# Patient Record
Sex: Female | Born: 1965 | Race: White | Hispanic: No | Marital: Married | Smoking: Never smoker
Health system: Southern US, Community
[De-identification: ages and names within clinical notes are randomized; demographics above are authoritative.]

## PROBLEM LIST (undated history)

## (undated) DIAGNOSIS — J189 Pneumonia, unspecified organism: Secondary | ICD-10-CM

## (undated) DIAGNOSIS — D649 Anemia, unspecified: Secondary | ICD-10-CM

## (undated) DIAGNOSIS — E538 Deficiency of other specified B group vitamins: Secondary | ICD-10-CM

## (undated) DIAGNOSIS — M1990 Unspecified osteoarthritis, unspecified site: Secondary | ICD-10-CM

## (undated) DIAGNOSIS — E039 Hypothyroidism, unspecified: Secondary | ICD-10-CM

## (undated) DIAGNOSIS — Z8489 Family history of other specified conditions: Secondary | ICD-10-CM

## (undated) DIAGNOSIS — K620 Anal polyp: Secondary | ICD-10-CM

## (undated) DIAGNOSIS — G709 Myoneural disorder, unspecified: Secondary | ICD-10-CM

## (undated) DIAGNOSIS — E785 Hyperlipidemia, unspecified: Secondary | ICD-10-CM

## (undated) DIAGNOSIS — K5792 Diverticulitis of intestine, part unspecified, without perforation or abscess without bleeding: Secondary | ICD-10-CM

## (undated) DIAGNOSIS — K5730 Diverticulosis of large intestine without perforation or abscess without bleeding: Secondary | ICD-10-CM

## (undated) DIAGNOSIS — K635 Polyp of colon: Secondary | ICD-10-CM

## (undated) DIAGNOSIS — K2980 Duodenitis without bleeding: Secondary | ICD-10-CM

## (undated) DIAGNOSIS — I10 Essential (primary) hypertension: Secondary | ICD-10-CM

## (undated) DIAGNOSIS — R1032 Left lower quadrant pain: Secondary | ICD-10-CM

## (undated) DIAGNOSIS — K219 Gastro-esophageal reflux disease without esophagitis: Secondary | ICD-10-CM

## (undated) HISTORY — DX: Hyperlipidemia, unspecified: E78.5

## (undated) HISTORY — DX: Anal polyp: K62.0

## (undated) HISTORY — DX: Deficiency of other specified B group vitamins: E53.8

## (undated) HISTORY — DX: Hypothyroidism, unspecified: E03.9

## (undated) HISTORY — DX: Polyp of colon: K63.5

## (undated) HISTORY — DX: Diverticulitis of intestine, part unspecified, without perforation or abscess without bleeding: K57.92

## (undated) HISTORY — DX: Left lower quadrant pain: R10.32

## (undated) HISTORY — DX: Duodenitis without bleeding: K29.80

## (undated) HISTORY — DX: Diverticulosis of large intestine without perforation or abscess without bleeding: K57.30

---

## 1998-02-28 ENCOUNTER — Ambulatory Visit (HOSPITAL_COMMUNITY): Admission: RE | Admit: 1998-02-28 | Discharge: 1998-02-28 | Payer: Self-pay | Admitting: Obstetrics and Gynecology

## 1998-02-28 ENCOUNTER — Encounter: Payer: Self-pay | Admitting: Obstetrics and Gynecology

## 1999-06-25 ENCOUNTER — Other Ambulatory Visit: Admission: RE | Admit: 1999-06-25 | Discharge: 1999-06-25 | Payer: Self-pay | Admitting: Obstetrics and Gynecology

## 2000-08-19 ENCOUNTER — Other Ambulatory Visit: Admission: RE | Admit: 2000-08-19 | Discharge: 2000-08-19 | Payer: Self-pay | Admitting: Obstetrics and Gynecology

## 2001-10-27 ENCOUNTER — Other Ambulatory Visit: Admission: RE | Admit: 2001-10-27 | Discharge: 2001-10-27 | Payer: Self-pay | Admitting: Obstetrics and Gynecology

## 2001-11-02 ENCOUNTER — Ambulatory Visit (HOSPITAL_COMMUNITY): Admission: RE | Admit: 2001-11-02 | Discharge: 2001-11-02 | Payer: Self-pay | Admitting: Obstetrics and Gynecology

## 2001-11-02 ENCOUNTER — Encounter: Payer: Self-pay | Admitting: Obstetrics and Gynecology

## 2003-01-15 ENCOUNTER — Other Ambulatory Visit: Admission: RE | Admit: 2003-01-15 | Discharge: 2003-01-15 | Payer: Self-pay | Admitting: Obstetrics and Gynecology

## 2004-07-19 ENCOUNTER — Encounter (INDEPENDENT_AMBULATORY_CARE_PROVIDER_SITE_OTHER): Payer: Self-pay | Admitting: *Deleted

## 2004-07-19 ENCOUNTER — Ambulatory Visit (HOSPITAL_COMMUNITY): Admission: AD | Admit: 2004-07-19 | Discharge: 2004-07-19 | Payer: Self-pay | Admitting: Obstetrics and Gynecology

## 2005-05-04 HISTORY — PX: ESOPHAGOGASTRODUODENOSCOPY: SHX1529

## 2005-12-01 ENCOUNTER — Ambulatory Visit: Payer: Self-pay | Admitting: Gastroenterology

## 2005-12-07 ENCOUNTER — Ambulatory Visit: Payer: Self-pay | Admitting: Gastroenterology

## 2005-12-14 ENCOUNTER — Ambulatory Visit: Payer: Self-pay | Admitting: Gastroenterology

## 2005-12-22 ENCOUNTER — Ambulatory Visit: Payer: Self-pay | Admitting: Gastroenterology

## 2006-01-21 ENCOUNTER — Ambulatory Visit (HOSPITAL_COMMUNITY): Admission: RE | Admit: 2006-01-21 | Discharge: 2006-01-21 | Payer: Self-pay | Admitting: Gastroenterology

## 2006-05-04 DIAGNOSIS — K5792 Diverticulitis of intestine, part unspecified, without perforation or abscess without bleeding: Secondary | ICD-10-CM

## 2006-05-04 HISTORY — PX: COLON RESECTION: SHX5231

## 2006-05-04 HISTORY — DX: Diverticulitis of intestine, part unspecified, without perforation or abscess without bleeding: K57.92

## 2006-05-04 HISTORY — PX: ABDOMINAL HYSTERECTOMY: SUR658

## 2006-09-10 ENCOUNTER — Ambulatory Visit: Payer: Self-pay | Admitting: Cardiology

## 2007-02-23 ENCOUNTER — Inpatient Hospital Stay (HOSPITAL_COMMUNITY): Admission: RE | Admit: 2007-02-23 | Discharge: 2007-02-27 | Payer: Self-pay | Admitting: General Surgery

## 2007-02-23 ENCOUNTER — Encounter (INDEPENDENT_AMBULATORY_CARE_PROVIDER_SITE_OTHER): Payer: Self-pay | Admitting: General Surgery

## 2009-03-15 ENCOUNTER — Encounter (INDEPENDENT_AMBULATORY_CARE_PROVIDER_SITE_OTHER): Payer: Self-pay | Admitting: *Deleted

## 2009-03-15 ENCOUNTER — Telehealth: Payer: Self-pay | Admitting: Gastroenterology

## 2009-03-19 ENCOUNTER — Ambulatory Visit: Payer: Self-pay | Admitting: Gastroenterology

## 2009-03-19 DIAGNOSIS — R1032 Left lower quadrant pain: Secondary | ICD-10-CM | POA: Insufficient documentation

## 2009-03-19 DIAGNOSIS — K5730 Diverticulosis of large intestine without perforation or abscess without bleeding: Secondary | ICD-10-CM | POA: Insufficient documentation

## 2009-03-20 ENCOUNTER — Ambulatory Visit: Payer: Self-pay | Admitting: Cardiology

## 2009-03-21 ENCOUNTER — Telehealth (INDEPENDENT_AMBULATORY_CARE_PROVIDER_SITE_OTHER): Payer: Self-pay | Admitting: *Deleted

## 2009-03-22 LAB — CONVERTED CEMR LAB
Hemoglobin: 12.7 g/dL (ref 12.0–15.0)
Leukocytes, UA: NEGATIVE
Lymphs Abs: 2.1 10*3/uL (ref 0.7–4.0)
MCHC: 34.9 g/dL (ref 30.0–36.0)
MCV: 91.1 fL (ref 78.0–100.0)
Nitrite: NEGATIVE
Platelets: 208 10*3/uL (ref 150.0–400.0)
RBC: 4 M/uL (ref 3.87–5.11)
Specific Gravity, Urine: >=1.03 (ref 1.000–1.030)
Total Protein, Urine: NEGATIVE mg/dL
Urobilinogen, UA: 0.2 (ref 0.0–1.0)
WBC: 6 10*3/uL (ref 4.5–10.5)
pH: 5 (ref 5.0–8.0)

## 2009-04-15 DIAGNOSIS — E538 Deficiency of other specified B group vitamins: Secondary | ICD-10-CM | POA: Insufficient documentation

## 2009-04-15 DIAGNOSIS — E039 Hypothyroidism, unspecified: Secondary | ICD-10-CM | POA: Insufficient documentation

## 2009-07-18 ENCOUNTER — Encounter: Admission: RE | Admit: 2009-07-18 | Discharge: 2009-07-18 | Payer: Self-pay | Admitting: Obstetrics and Gynecology

## 2010-05-04 HISTORY — PX: COLONOSCOPY: SHX174

## 2010-05-25 ENCOUNTER — Encounter: Payer: Self-pay | Admitting: Nurse Practitioner

## 2010-09-16 NOTE — Op Note (Signed)
NAMEBRADLEY, Emily Carroll             ACCOUNT NO.:  [id number]   MEDICAL RECORD NO.:  [id number]          PATIENT TYPE:  INP   LOCATION:  1534                         FACILITY:  [facility]   PHYSICIAN:  Michelle L. Grewal, M.D.DATE OF BIRTH:  [birth year]   DATE OF PROCEDURE:  02/23/2007  DATE OF DISCHARGE:                               OPERATIVE REPORT   PREOPERATIVE DIAGNOSIS:  Diverticulitis and symptomatic fibroids and  menorrhagia.   POSTOPERATIVE DIAGNOSIS:  Diverticulitis and symptomatic fibroids and  menorrhagia.   PROCEDURE:  Exploratory laparotomy and total abdominal hysterectomy.   SURGEON:  Dr. Vincente Poli.   COSURGEON:  Dr. Glenna Fellows.   ANESTHESIA:  General.   ESTIMATED BLOOD LOSS:  200 mL with this procedure.   PROCEDURE:  The patient was taken to the operating room initially by Dr.  Johna Sheriff and Dr. Jamey Ripa.  She had been consented prior to surgery.  Dr.  Johna Sheriff started the surgery after she was intubated and that portion of  the surgery will be dictated by him.  Briefly he performed a  Pfannenstiel incision and performed partial sigmoid colon resection.  After he finished that procedure, I then scrubbed in.  At the point that  I scrubbed and there was a small Pfannenstiel incision and an  infraumbilical incision from his laparoscopic port.  We then did a  thorough exam of the pelvis.  There was no active bleeding at this  point.  The uterus was very myomatous and she had a very large baseball  size fibroid that was anterior and more toward the left growing into the  left side of the broad ligament.  Her ovary appeared normal.  Her right  ovary was slightly adherent posteriorly, but I was able to free that up  with blunt dissection.  I previously had consented her for possible left  oophorectomy; however the patient did not need that.  We decided to  proceed initially with just hysterectomy.  We then placed Kelly clamps  beside each triple pedicle, elevated the  uterus all the way through the  incision, identified each round ligament, transected that, created our  bladder flap sharply and then digitally.  We then placed curved Heaney  clamps just across the triple pedicle on either side.  Each pedicle was  cut and suture ligated using 0 Vicryl suture.  We then walked our way  down the broad ligament on each side and then clamped the uterine artery  on each side using curved Heaney clamps.  The pedicle was suture ligated  using 0 Vicryl suture.  I then developed the bladder flap more  thoroughly.  We then dissected and clamped using straight Heaney clamps  using straight Heaney clamps just beside the cervix staying just beside  the cervix and we walked our way down the cardinal and the uterosacral  ligament.  Once we did this, we then reached the level of the external  os and then we removed the specimen completely.  Each pedicle had been  suture ligated using O Vicryl suture and after the specimen was removed  and we confirmed the external cervical os had  been removed as well.  We  then closed the vaginal cuff using figure-of-eights using 0 Vicryl  suture.  Irrigation was performed.  Hemostasis was noted.  Dr. Johna Sheriff  looked also at his anastomosis site and it appeared to be normal.  All  laparotomy pads removed from the abdomen and at this point then Dr.  Johna Sheriff performed a closure of the  peritoneum and the fascia and the skin and that will be dictated by him.  I scrubbed out and all sponge, lap and instrument counts were correct  x2.  The patient went to recovery room in stable condition.   PATHOLOGY:  Uterus and cervix.      Michelle L. Vincente Poli, M.D.  Electronically Signed     MLG/MEDQ  D:  02/23/2007  T:  02/24/2007  Job:  [id number]   cc:   Lorne Skeens. Hoxworth, M.D.

## 2010-09-16 NOTE — Op Note (Signed)
Emily Carroll, Emily Carroll             ACCOUNT NO.:  [id number]   MEDICAL RECORD NO.:  [id number]          PATIENT TYPE:  INP   LOCATION:  0003                         FACILITY:  [facility]   PHYSICIAN:  Sharlet Salina T. Hoxworth, M.D.DATE OF BIRTH:  Feb 14, 1966   DATE OF PROCEDURE:  02/23/2007  DATE OF DISCHARGE:                               OPERATIVE REPORT   PRE AND POSTOPERATIVE DIAGNOSIS:  Diverticulitis of the left and sigmoid  colon.   SURGICAL PROCEDURES:  Hand assisted laparoscopic partial left colectomy.   SURGEON:  Lorne Skeens. Hoxworth, M.D.   ASSISTANT:  Dr. Cicero Duck   ANESTHESIA:  General.   BRIEF HISTORY:  Emily Carroll is a 45 year old female with repeated  episodes of diverticulitis involving the left and sigmoid colon  documented on CT scan and previous colonoscopy.  Due to repeated and  ongoing problems, we have elected to proceed with elective partial  colectomy.  The nature of the procedure, indications, risks of bleeding,  infection, and anastomotic complications have been discussed and  understood.  In addition, the patient has large symptomatic fibroids and  hysterectomy is planned by Dr. Vincente Poli at the end of her colectomy.   DESCRIPTION OF OPERATION:  The patient brought to the operating room and  placed in supine position on the operating table and general  endotracheal anesthesia was induced.  She had received a mechanical  antibiotic bowel prep at home.  Broad-spectrum IV antibiotics were given  intravenously.  PAS were placed.  She was carefully positioned in the  semilithotomy position on a bean bag and the abdomen and perineum  sterilely prepped and draped and Foley catheter placed.  Abdominal  access was obtained with a 1 cm supraumbilical incision open Hasson  technique through mattress suture of 0 Vicryl and pneumoperitoneum  established.  Under direct vision 5 mm trocars were placed in the right  and left lateral lower abdomen.  There was some  evidence of chronic  inflammatory change along the mid descending colon down toward the  sigmoid with adhesions up to the lateral abdominal wall.  At this point  a 7 cm Pfannenstiel incision was made in the suprapubic skin crease and  dissection carried down through subcutaneous tissue.  Anterior fascia  was divided transversely.  This was then elevated up off the rectus  muscles which were divided along the midline with cautery and the  abdomen entered.  A Gelport retractor system was placed.  On palpating  through the Gelport, there were some areas of inflammation and  thickening of the distal left and sigmoid colon.  Initially the left  sigmoid colon was elevated and beginning medially at the sacral  promontory the peritoneum was incised up along the rectosigmoid and left  colon and the mesentery of the left and sigmoid colon, dissected up out  of the retroperitoneum with careful blunt dissection.  This dissection  progressed adequately but possibly due to his inflammatory change of  diverticulitis.  It was really difficult to get into a really clean  avascular plane.  However, the iliac vessels were carefully identified  and protected and I was  unable, however, to clearly identify the ureter  through this approach.  No division of any mesentery was undertaken at  this point because of this.  At this point I then mobilized the colon  laterally.  There were some inflammatory attachments that were divided  with cautery.  Peritoneum along the line of Toldt of the rectosigmoid  extending up to the left colon was divided with cautery under direct  vision and the colon and mesentery bluntly dissected up out of the  retroperitoneum.  For this approach I could clearly see the left ureter  which was posterior and protected.  Dissection was carried up along the  splenic flexure which was completely mobilized dividing the splenocolic  ligament under direct vision.  The omentum was dissected up off  of the  distal transverse colon to about the mid transverse colon and the lesser  omentum also divided in the lesser sac working back to about the  midline, thoroughly mobilizing the distal transverse colon.  At this  point I felt likely had enough mobilization and the Gelport was removed  but bringing the specimen up through the rim of the small Pfannenstiel  incision, the diverticulitis was fairly proximal on the left colon.  I  was not quite able to bring it up through the wound.  I therefore  replaced the Gelport and this point I did find some further areolar  attachments along the mid-left colon posteriorly that tethered things  and these were carefully divided with cautery, again protecting the  ureter more fully mobilizing the mid and proximal left colon.  At this  point removing the Gelport, the specimen came up nicely through the  small incision and I could get back to healthy proximal bowel which was  probably proximal left colon and then the more distal rectosigmoid was  quite soft and normal.  The points of proximal and distal resection were  chosen.  The LigaSure was used to then divide the mesentery.  The ureter  had been identified and was well posterior.  The mesentery in the  involved segment was completely divided.  The rectosigmoid was divided  between [facility] and Kocher clamps and then further dissection was carried  up proximally to clearly normal colon.  At this point this was clamped  with a Kocher, stay sutures were placed in the proximal bowel, bowel  divided and the specimen removed.  Mesentery was inspected for  hemostasis which appeared complete.  Following this, sewn full-thickness  anastomosis using interrupted inverting 2-0 silk sutures was performed.  This appeared to be under no tension with good blood supply and widely  patent.  Following completion anastomosis, bowel was returned to the  abdominal cavity and laparoscopy was performed showing bowel was  under  no tension.  There was no bleeding.  The anastomosis appeared to be  lying normally without any twisting.  The abdomen was irrigated.  The  retractor and all gloves and instruments were all changed.  At this  point Dr. Vincente Poli came in and completed hysterectomy as dictated  separately.  Following completion hysterectomy, the Pfannenstiel  incision was closed with running 0 Vicryl on the midline peritoneum and  the anterior fascia was closed with running #1 PDS beginning at either  end and tied centrally.  The tissue was infiltrated with Marcaine.  The  mattress suture at the supraumbilical incision had been secured.  Skin  incisions were closed with running or interrupted subcuticular 4-0  Vicryl and Dermabond.  Sponges  counts correct.  The patient taken  recovery in good condition.      Lorne Skeens. Hoxworth, M.D.  Electronically Signed     BTH/MEDQ  D:  02/23/2007  T:  02/24/2007  Job:  [id number]

## 2010-09-19 NOTE — Assessment & Plan Note (Signed)
 [facility]                           GASTROENTEROLOGY OFFICE NOTE   NAME:[name], Emily Carroll                    MRN:          [id number]  DATE:12/22/2005                            DOB:          June 02, 1965    Dennie Bible has resolving diverticulitis, but continues with gas, bloating, B-12  deficiency requiring parenteral therapy.  I thought it was a good  possibility that she had bacterial overgrowth syndrome and treated her with  Xifaxin 400 mg t.i.d. along with Pro-Biotic therapy for ten days.  She  continued to complain of gas, bloating, belching and burping, despite being  on Prevacid at 30 mg a day.  She takes Synthroid for her chronic thyroid  dysfunction.   I have referred her to Dr. Jaclynn Guarneri for sigmoid resection since she has  had three episodes of diverticulitis in the last year and a half.  I have  given her a low gas diet and I have asked her to continue fiber as  tolerated.  Additional lab data showed normal celiac sprue panel and normal  screening laboratory tests except for a B-12 deficiency.  She is receiving  parenteral therapy.  I have sent by the lab to check intrinsic factor and  any parietal cell antibodies.                                   Vania Rea. Jarold Motto, MD, [place]een [place], [place]   DRP/MedQ  DD:  12/22/2005  DT:  12/22/2005  Job #:  [id number]

## 2010-09-19 NOTE — Op Note (Signed)
NAMESAKINAH, Emily Carroll             ACCOUNT NO.:  [id number]   MEDICAL RECORD NO.:  [id number]          PATIENT TYPE:  AMB   LOCATION:  SDC                           FACILITY:  [facility]   PHYSICIAN:  Michelle L. Grewal, M.D.DATE OF BIRTH:  Sep 05, 1965   DATE OF PROCEDURE:  07/19/2004  DATE OF DISCHARGE:                                 OPERATIVE REPORT   PREOPERATIVE DIAGNOSIS:  Missed abortion.   POSTOPERATIVE DIAGNOSIS:  Missed abortion.   PROCEDURE:  Dilatation and evacuation.   SURGEON:  Michelle L. Vincente Poli, M.D.   ANESTHESIA:  MAC with local.   SPECIMENS:  Products of conception.   ESTIMATED BLOOD LOSS:  Minimal.   COMPLICATIONS:  None.   DESCRIPTION OF PROCEDURE:  The patient was taken to the operating room.  She  was given sedation and placed in the lithotomy position.  She was prepped  and draped in the usual sterile fashion.  An in-and-out catheter was used to  empty the bladder.   A speculum was inserted into the vagina.  The cervix was grasped with a  tenaculum, and a paracervical block was performed in the standard fashion.  The cervical internal os was gently dilated using Pratt dilators.  The  cervix was a little open already.  We then inserted the #7 suction cannula,  and suction curettage was performed x2, with retrieval of minimal tissue.  A  sharp curette was inserted, and the uterine cavity was thoroughly curetted  of all tissue which appeared slightly necrotic and consistent with products  of conception.  A final suction curettage was then performed.  At the end of  the procedure, all sponge, lap, and instrument count were correct x2.  All  instruments were removed from the vagina.   The patient tolerated the procedure extremely well and went to the recovery  room in stable condition.      MLG/MEDQ  D:  07/19/2004  T:  07/20/2004  Job:  [id number]

## 2010-09-19 NOTE — Assessment & Plan Note (Signed)
[facility]                           GASTROENTEROLOGY OFFICE NOTE   NAME:Mode, BRANDACE CARGLE                    MRN:          [id number]  DATE:12/01/2005                            DOB:          1966-04-10    Mrs. Galasso is a 45 year old white female, Engineer, drilling at [employer],  referred through the courtesy of [facility] and Dr. Lelon Huh for evaluation  of upper and lower gastrointestinal complaints consistent with mostly of  lower abdominal discomfort with diverticulitis concern by CT scan.  Also  chronic abdominal gas, bloating, and early satiety.  This patient has had  two episodes over the last 1-1/2 years of left lower quadrant pain  associated with nausea and vomiting, low-grade fever, and change in bowel  habits.  She most recently saw Dr. Lelon Huh at [facility] and was felt to  have diverticulitis and was treated with metronidazole for two weeks after  CT scan. On November 13, 2005, showed descending colon and sigmoid colon  diverticulitis.  She is currently asymptomatic in terms of lower GI  complaints.  During the illness, she did have some nausea and vomiting and  what sounds like a small amount of hematemesis.  She has had years of  postprandial gas, bloating, belching, burping, and early satiety but no  associated weight loss.  She relates that she had diagnosed ulcer years  ago by upper GI series.  She denies abuse of NSAIDs, alcohol, or cigarettes.  She generally follows a regular diet and denies any specific food  intolerances.  She has never had endoscopic exams of her bowels.   She currently is having fairly regular bowel habits and denies significant  diarrhea or constipation or rectal bleeding.   PAST MEDICAL HISTORY:  Otherwise noncontributory except for history of  chronic thyroid dysfunction.   MEDICATIONS:  1.  She is on Synthroid 0.8 mcg q.a.m. and 0.1 mg q.p.m.  2.  She uses p.r.n. Aleve.   ALLERGIES:  She denies drug  allergies.   FAMILY HISTORY:  Remarkable for colon polyps in her paternal grandfather.  Otherwise noncontributory.   SOCIAL HISTORY:  She is married and lives with her husband.  She has some  college education.  She works in Presenter, broadcasting at [employer].  She does not smoke  or use ethanol.   REVIEW OF SYSTEMS:  Noncontributory.  Her last menstrual period was a week  ago.  She does suffer from uterine fibroids.  Apparently is unable to have  children.   PHYSICAL EXAMINATION:  GENERAL:  Exam shows her to be an attractive, healthy-  appearing white female who appears her stated age.  VITAL SIGNS:  She is 5 feet 6 inches tall, weighs 211 pounds.  Blood  pressure 116/84, pulse 76 and regular.  I could not appreciate stigmata of  chronic liver disease, thyromegaly or lymphadenopathy.  CHEST:  Entirely clear to percussion and auscultation.  CARDIAC:  There are no murmurs, gallops or rubs noted.  ABDOMEN:  I could not appreciate hepatosplenomegaly, abdominal masses or  tenderness.  Bowel sounds are normal.  EXTREMITIES:  Unremarkable.  Reflexes normal.  MENTAL STATUS:  Clear.  RECTAL:  Inspection of the rectum was unremarkable as was the rectal exam.  Stool was guaiac negative.   IMPRESSION:  1.  Recurrent diverticulitis in a relatively young, 45 year old white      female.  Certainly raising the need for elective sigmoid colon      resection.  2.  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  3.  Vague history of peptic ulcer disease.  4.  Vague history of hematemesis.  5.  Symptomatic uterine fibroids.  6.  History of chronic thyroid dysfunction.   RECOMMENDATION:  1.  Outpatient endoscopy and colonoscopy.  2.  High fiber diet with daily Citrucel and liberal p.o. fluids.  3.  Patient education on diverticulitis and its management.  4.  P.r.n. Levsin 0.125 mg every four to six hours.  5.   Screening laboratory parameters.  6.  Other medications as per primary care.                                   Vania Rea. Jarold Motto, MD, [place], [place]   DRP/MedQ  DD:  12/01/2005  DT:  12/01/2005  Job #:  [id number]   cc:   Deboraha Sprang, M.D.

## 2010-09-19 NOTE — Discharge Summary (Signed)
Emily Carroll, [name]             ACCOUNT NO.:  [id number]   MEDICAL RECORD NO.:  [id number]          PATIENT TYPE:  INP   LOCATION:  1534                         FACILITY:  [facility]   PHYSICIAN:  Sharlet Salina T. Hoxworth, M.D.DATE OF BIRTH:  1965/06/12   DATE OF ADMISSION:  02/23/2007  DATE OF DISCHARGE:  02/27/2007                               DISCHARGE SUMMARY   DISCHARGE DIAGNOSES:  1. Left colonic diverticulitis.  2. Myomatous uterus.   SURGICAL PROCEDURES:  1,  Laparoscopic partial left colectomy, Dr.  Johna Sheriff, on February 23, 2007.  1. Hysterectomy, Dr. Vincente Poli, October 22.   HISTORY OF PRESENT ILLNESS:  Emily Carroll is a 45 year old white female  with a history of multiple prior episodes of sigmoid colon  diverticulitis.  These have been managed by antibiotics but are  increasingly frequent and severe in nature.  The patient also has a  large symptomatic myomatous uterus and requires a hysterectomy.  In  consultation with Dr. Vincente Poli we have planned elective laparoscopic left  colectomy with a Pfannenstiel extraction site to be followed by  hysterectomy by Dr. Vincente Poli.  The patient is admitted for these  procedures.   PAST MEDICAL HISTORY:  Previous surgery significant only for D&C.  She  has hypothyroidism.   MEDICATIONS:  Synthroid 0.188 daily.   ALLERGIES:  None.   SOCIAL HISTORY, FAMILY HISTORY, REVIEW OF SYSTEMS:  See detailed H&P.   PERTINENT PHYSICAL EXAMINATION:  In general, well-developed female in no  acute distress, afebrile, vital signs within normal limits.  Abdomen is  soft, no tenderness and no palpable masses anteriorly.   HOSPITAL COURSE:  The patient was admitted on the morning of her  procedure following a mechanical antibiotic bowel prep at home.  She  underwent laparoscopic left partial colectomy with takedown of splenic  flexure with an area of significant acute and chronic diverticulitis in  the distal left colon.  Through a slightly enlarged  Pfannenstiel  extraction site, Dr. Vincente Poli then performed hysterectomy.  Her  postoperative course was smooth.  She had minimal pain and was started  on a clear liquid diet on the first postoperative day.  By the second  postoperative day she was having some flatus and diet was advanced as  tolerated.  She became progressively more ambulatory.  Diet was  advanced.  On October 26 she had had bowel movements.  Abdomen was soft  and nontender, wound healing  without infection.  Final pathology revealed diverticulosis and focal  diverticulitis in the left colon, and leiomyomata, benign, in the  uterus.  Discharge medications are the same as admission plus Vicodin  for pain.  Followup is to be in my office in 2 weeks.      Lorne Skeens. Hoxworth, M.D.  Electronically Signed     BTH/MEDQ  D:  03/21/2007  T:  03/22/2007  Job:  [id number]   cc:   Marcelino Duster L. Vincente Poli, M.D.  Fax: [phone]

## 2010-11-03 ENCOUNTER — Encounter: Payer: Self-pay | Admitting: Gastroenterology

## 2010-12-11 ENCOUNTER — Ambulatory Visit (INDEPENDENT_AMBULATORY_CARE_PROVIDER_SITE_OTHER): Payer: BC Managed Care – PPO | Admitting: Gastroenterology

## 2010-12-11 ENCOUNTER — Encounter: Payer: Self-pay | Admitting: Gastroenterology

## 2010-12-11 VITALS — BP 138/78 | HR 88 | Ht 66.5 in | Wt 229.0 lb

## 2010-12-11 DIAGNOSIS — Z9889 Other specified postprocedural states: Secondary | ICD-10-CM

## 2010-12-11 DIAGNOSIS — K644 Residual hemorrhoidal skin tags: Secondary | ICD-10-CM

## 2010-12-11 DIAGNOSIS — Z9049 Acquired absence of other specified parts of digestive tract: Secondary | ICD-10-CM

## 2010-12-11 DIAGNOSIS — K621 Rectal polyp: Secondary | ICD-10-CM

## 2010-12-11 DIAGNOSIS — K620 Anal polyp: Secondary | ICD-10-CM

## 2010-12-11 MED ORDER — PEG-KCL-NACL-NASULF-NA ASC-C 100 G PO SOLR: 1.0000 | Freq: Once | ORAL | Status: DC

## 2010-12-11 NOTE — Progress Notes (Signed)
This is a very pleasant 45 year old Caucasian female who has had previous sigmoid resection in 2008 Dr. Glenna Fellows. Last colonoscopy was performed in 2007. She's been doing well except for recent discomfort around her rectum with some painful wiping. She denies significant abdominal pain, bleeding, 4 history of rectal fissures or fistulae. She occasional has some left upper quadrant spasmodic type pain of unexplained etiology. She otherwise is healthy except for chronic thyroid dysfunction treated with thyroid replacement therapy.  Current Medications, Allergies, Past Medical History, Past Surgical History, Family History and Social History were reviewed in [employer] record.  Pertinent Review of Systems Negative   Physical Exam: Awake alert no acute distress appears stated age. Abdominal exam is benign without organomegaly, masses, or tenderness. I cannot appreciate splenomegaly in the left upper quadrant area. The specks the rectum does show a posterior skin tag-anal polyp is not eroded painful. I cannot appreciate any fissures or fistulae but digital exam was deferred.      Assessment and Plan: Posterior skin tag probably related to previous anal fissure. I will repeat followup colonoscopy to be complete, and refer her to Dr. Glenna Fellows for consideration of local excision of this irritating perianal skin tag. I provided a zinc oxide ointment to use on when necessary basis in the interim.  Please copy her primary care physician, referring physician, and pertinent subspecialists. Encounter Diagnosis  Name Primary?  . Rectal polyp Yes

## 2010-12-11 NOTE — Patient Instructions (Signed)
Your procedure has been scheduled for 12/29/2010, please follow the seperate instructions.  Your prescription(s) have been sent to you pharmacy.  We have referred you over to [facility] and we will contact you with an appt.  Buy Xinc Oxide OTC and use per rectum as needed.

## 2010-12-26 ENCOUNTER — Ambulatory Visit (INDEPENDENT_AMBULATORY_CARE_PROVIDER_SITE_OTHER): Payer: BC Managed Care – PPO | Admitting: General Surgery

## 2010-12-26 ENCOUNTER — Encounter (INDEPENDENT_AMBULATORY_CARE_PROVIDER_SITE_OTHER): Payer: Self-pay | Admitting: General Surgery

## 2010-12-26 VITALS — BP 142/98 | Temp 97.4°F

## 2010-12-26 DIAGNOSIS — K644 Residual hemorrhoidal skin tags: Secondary | ICD-10-CM

## 2010-12-26 NOTE — Progress Notes (Signed)
Chief complaint: Anal polyp  History: Patient returns to the office due to an anal polyp. She recently saw Dr. Jarold Motto. She is do routine colonoscopy. She has a history of sigmoid resection for diverticulitis by me in 2008. As far as her abdomen and GI tract she is doing well. She however notices some excess tissue at the anus and difficulty with hygiene because of this.  Physical exam: Gen.: An overweight but otherwise appears healthy Abdomen: Soft and nontender. Incisions well healed. Rectal: At the anal verge is a fairly large skin tag but this does not appear to be neoplastic.  Assessment plan: Anal skin tag. This is narrow based and I believe could be removed very nicely under local anesthesia in the office. She is however having her colonoscopy in 3 days and we elected to defer until this is completed. I will see her back in the near future and we will remove this in the office.

## 2010-12-26 NOTE — Patient Instructions (Signed)
Call for any questions or concerns.

## 2010-12-29 ENCOUNTER — Encounter: Payer: Self-pay | Admitting: Gastroenterology

## 2010-12-29 ENCOUNTER — Ambulatory Visit (AMBULATORY_SURGERY_CENTER): Payer: BC Managed Care – PPO | Admitting: Gastroenterology

## 2010-12-29 VITALS — BP 141/75 | HR 79 | Temp 99.4°F | Resp 14 | Ht 67.2 in | Wt 229.0 lb

## 2010-12-29 DIAGNOSIS — K5730 Diverticulosis of large intestine without perforation or abscess without bleeding: Secondary | ICD-10-CM

## 2010-12-29 DIAGNOSIS — D126 Benign neoplasm of colon, unspecified: Secondary | ICD-10-CM

## 2010-12-29 DIAGNOSIS — D139 Benign neoplasm of ill-defined sites within the digestive system: Secondary | ICD-10-CM

## 2010-12-29 DIAGNOSIS — Z1211 Encounter for screening for malignant neoplasm of colon: Secondary | ICD-10-CM

## 2010-12-29 DIAGNOSIS — K620 Anal polyp: Secondary | ICD-10-CM

## 2010-12-29 DIAGNOSIS — K621 Rectal polyp: Secondary | ICD-10-CM

## 2010-12-29 DIAGNOSIS — Z9049 Acquired absence of other specified parts of digestive tract: Secondary | ICD-10-CM

## 2010-12-29 MED ORDER — SODIUM CHLORIDE 0.9 % IV SOLN: 500.0000 mL | INTRAVENOUS | Status: DC

## 2010-12-29 NOTE — Progress Notes (Signed)
Pt voiced no complaints in the recovery room or on discharge. MAW

## 2010-12-29 NOTE — Patient Instructions (Signed)
See the picture page for your findings from your exam today.  Follow the green and blue discharge instruction sheets the rest of the day.  Resume your prior medications today. Per Dr. Jarold Motto surgical excision of anal papillae with Dr. Johna Sheriff - appointment already made.  Please call if any questions or concerns.

## 2010-12-30 ENCOUNTER — Telehealth: Payer: Self-pay | Admitting: *Deleted

## 2010-12-30 NOTE — Telephone Encounter (Signed)
No ID on voice mail:therefore, no message was left at this time.

## 2011-01-02 ENCOUNTER — Encounter: Payer: Self-pay | Admitting: Gastroenterology

## 2011-02-11 LAB — ABO/RH: ABO/RH(D): A POS

## 2011-02-11 LAB — CBC
HCT: 32.9 — ABNORMAL LOW
Hemoglobin: 13.6
MCHC: 34.4
MCV: 87.7
MCV: 88.4
RBC: 4.5
WBC: 7.6

## 2011-02-11 LAB — DIFFERENTIAL
Basophils Relative: 0
Eosinophils Absolute: 0.1
Eosinophils Relative: 1
Lymphocytes Relative: 28
Monocytes Absolute: 0.5
Monocytes Relative: 7

## 2011-02-11 LAB — COMPREHENSIVE METABOLIC PANEL
CO2: 27
Chloride: 105
Creatinine, Ser: 0.76
GFR calc Af Amer: >60
Potassium: 3.9
Sodium: 139
Total Protein: 6.8

## 2011-02-11 LAB — URINALYSIS, ROUTINE W REFLEX MICROSCOPIC
Glucose, UA: NEGATIVE
Ketones, ur: NEGATIVE
pH: 5.5

## 2011-02-11 LAB — PREGNANCY, URINE: Preg Test, Ur: NEGATIVE

## 2011-03-12 ENCOUNTER — Other Ambulatory Visit: Payer: Self-pay | Admitting: Internal Medicine

## 2011-03-12 DIAGNOSIS — E039 Hypothyroidism, unspecified: Secondary | ICD-10-CM

## 2011-03-16 ENCOUNTER — Other Ambulatory Visit: Payer: BC Managed Care – PPO

## 2011-03-30 ENCOUNTER — Other Ambulatory Visit: Payer: BC Managed Care – PPO

## 2011-03-30 ENCOUNTER — Ambulatory Visit
Admission: RE | Admit: 2011-03-30 | Discharge: 2011-03-30 | Disposition: A | Discharge status: Home / Self Care | Payer: BC Managed Care – PPO | Source: Ambulatory Visit | Attending: Internal Medicine | Admitting: Internal Medicine

## 2011-03-30 DIAGNOSIS — E039 Hypothyroidism, unspecified: Secondary | ICD-10-CM

## 2011-04-16 ENCOUNTER — Encounter (INDEPENDENT_AMBULATORY_CARE_PROVIDER_SITE_OTHER): Payer: Self-pay | Admitting: General Surgery

## 2011-11-26 ENCOUNTER — Other Ambulatory Visit (INDEPENDENT_AMBULATORY_CARE_PROVIDER_SITE_OTHER): Payer: Self-pay

## 2011-11-26 ENCOUNTER — Ambulatory Visit (INDEPENDENT_AMBULATORY_CARE_PROVIDER_SITE_OTHER): Payer: BC Managed Care – PPO | Admitting: General Surgery

## 2011-11-26 ENCOUNTER — Encounter (INDEPENDENT_AMBULATORY_CARE_PROVIDER_SITE_OTHER): Payer: Self-pay | Admitting: General Surgery

## 2011-11-26 VITALS — BP 124/78 | HR 96 | Temp 98.0°F | Resp 20 | Ht 66.0 in | Wt 199.0 lb

## 2011-11-26 DIAGNOSIS — K644 Residual hemorrhoidal skin tags: Secondary | ICD-10-CM

## 2011-11-26 DIAGNOSIS — K620 Anal polyp: Secondary | ICD-10-CM

## 2011-11-26 MED ORDER — LIDOCAINE HCL 2 % EX GEL: CUTANEOUS | Status: AC

## 2011-11-26 NOTE — Patient Instructions (Signed)
Expect some mild bleeding. Clean and shower or tub after bowel movements. Call as needed for problems or questions.

## 2011-11-26 NOTE — Progress Notes (Signed)
Chief complaint: Anal polyp  History: Patient returns to the office. She has had a colonoscopy which was unremarkable. The polyp at her anus continues to bother her with irritation and hygiene issues. She returns for removal.  Exam: On the anterior anal verge is a narrow based approximately 1.5 cm anal polyp or papilloma.  After explaining the procedure under local anesthesia this was completely excised and sent for permanent pathology. The base was cauterized. Wound care was explained. She is to return as needed.

## 2011-12-01 ENCOUNTER — Telehealth (INDEPENDENT_AMBULATORY_CARE_PROVIDER_SITE_OTHER): Payer: Self-pay

## 2011-12-01 NOTE — Progress Notes (Signed)
Called patient -- Left voice message to call our office for results.

## 2011-12-01 NOTE — Telephone Encounter (Signed)
Left message for patient to call our office re: Pathology results (Benign Polyps)

## 2012-01-28 ENCOUNTER — Other Ambulatory Visit: Payer: Self-pay | Admitting: Obstetrics and Gynecology

## 2013-10-04 ENCOUNTER — Encounter: Payer: Self-pay | Admitting: *Deleted

## 2013-10-04 ENCOUNTER — Ambulatory Visit: Payer: BC Managed Care – PPO | Admitting: Gastroenterology

## 2013-10-11 ENCOUNTER — Encounter: Payer: Self-pay | Admitting: *Deleted

## 2013-10-11 ENCOUNTER — Ambulatory Visit (INDEPENDENT_AMBULATORY_CARE_PROVIDER_SITE_OTHER): Payer: BC Managed Care – PPO | Admitting: Gastroenterology

## 2013-10-11 VITALS — BP 116/72 | HR 76 | Ht 66.0 in | Wt 210.0 lb

## 2013-10-11 DIAGNOSIS — R109 Unspecified abdominal pain: Secondary | ICD-10-CM | POA: Insufficient documentation

## 2013-10-11 DIAGNOSIS — R1011 Right upper quadrant pain: Secondary | ICD-10-CM

## 2013-10-11 DIAGNOSIS — R10A1 Flank pain, right side: Secondary | ICD-10-CM

## 2013-10-11 NOTE — Progress Notes (Addendum)
     10/11/2013 Emily Carroll [id number] 07/14/1965   History of Present Illness:  This is a pleasant 48 year old female who is previously known to Dr. Sharlett Iles. She underwent a colonoscopy most recently in August 2012 at which time she was found to have some nodularity at her sigmoid anastomosis. Biopsies showed benign colonic mucosa with hyperplastic changes. She also had some hypertrophied anal papilla. Recommended repeat colonoscopy was in 10 years from that time. She had previous left hemicolectomy for diverticulitis issues in 2008.  She presents to our office today with complaints of right-sided flank/right upper quadrant abdominal pain. She states that this has been present for the last couple of months. She describes it as a soreness, and states that it is nothing excruciating. Some days she doesn't pay much attention to it, but if she puts pressure on the area when sleeping at night, etc., then it bothers her. It also seems to be worse with movement, but she did not feel it is muscular in origin, and she denies any injury to that area. She admits to 3 episodes of nausea since this pain has been present. She says on each episode the nausea has occurred at nighttime and woke her from sleep. She denies any vomiting. She also admits to some intermittent diarrhea at times as well over the past couple of months, but for the most part she has constipation, which is not new for her. She's never experienced any similar pains or discomfort in the past in that location, but describes it as feeling similar to her previous diverticular pains that she had on the left side.  She denies fevers or any other possibly associated symptoms. Ultrasound of the abdomen was ordered by her PCP and showed no abnormalities.  CBC was normal as well.   Current Medications, Allergies, Past Medical History, Past Surgical History, Family History and Social History were reviewed in [employer]  record.   Physical Exam: BP 116/72  Pulse 76  Ht 5\' 6"  (1.676 m)  Wt 210 lb (95.255 kg)  BMI 33.91 kg/m2 General: Well developed white female in no acute distress Head: Normocephalic and atraumatic Eyes:  Sclerae anicteric, conjunctiva pink  Ears: Normal auditory acuity Lungs: Clear throughout to auscultation Heart: Regular rate and rhythm Abdomen: Soft, non-distended.  Normal bowel sounds.  Mild RUQ/right flank TTP just under her ribs without R/R/G. Musculoskeletal: Symmetrical with no gross deformities  Extremities: No edema  Neurological: Alert oriented x 4, grossly non-focal Psychological:  Alert and cooperative. Normal mood and affect  Assessment and Recommendations: -Right flank/RUQ abdominal pain:  ? Source.  ? Musculoskeletal (although patient does not think that it is) vs gallbladder dysfunction vs other etiologies.  Could consider HIDA scan if CT scan negative.  Addendum: Reviewed and agree with initial management. Await CT results, further recommendations thereafter Jerene Bears, MD

## 2013-10-11 NOTE — Patient Instructions (Signed)
You have been scheduled for a CT scan of the abdomen and pelvis at [facility] ([address]---this is in the same building as [facility]).   You are scheduled on 10-18-2013 at 9 am. You should arrive 15 minutes prior to your appointment time for registration. Please follow the written instructions below on the day of your exam:  WARNING: IF YOU ARE ALLERGIC TO IODINE/X-RAY DYE, PLEASE NOTIFY RADIOLOGY IMMEDIATELY AT [phone]! YOU WILL BE GIVEN A 13 HOUR PREMEDICATION PREP.  1) Do not eat or drink anything after 5 am (4 hours prior to your test) 2) You have been given 2 bottles of oral contrast to drink. The solution may taste better if refrigerated, but do NOT add ice or any other liquid to this solution. Shake well before drinking.    Drink 1 bottle of contrast @ 7 am (2 hours prior to your exam)  Drink 1 bottle of contrast @ 8 am (1 hour prior to your exam)  You may take any medications as prescribed with a small amount of water except for the following: Metformin, Glucophage, Glucovance, Avandamet, Riomet, Fortamet, Actoplus Met, Janumet, Glumetza or Metaglip. The above medications must be held the day of the exam AND 48 hours after the exam.  The purpose of you drinking the oral contrast is to aid in the visualization of your intestinal tract. The contrast solution may cause some diarrhea. Before your exam is started, you will be given a small amount of fluid to drink. Depending on your individual set of symptoms, you may also receive an intravenous injection of x-ray contrast/dye. Plan on being at [facility] for 30 minutes or long, depending on the type of exam you are having performed.  This test typically takes 30-45 minutes to complete.  If you have any questions regarding your exam or if you need to reschedule, you may call the CT department at [phone] between the hours of 8:00 am and 5:00 pm,  Monday-Friday.  ________________________________________________________________________

## 2013-10-18 ENCOUNTER — Inpatient Hospital Stay: Admission: RE | Admit: 2013-10-18 | Payer: BC Managed Care – PPO | Source: Ambulatory Visit

## 2013-10-24 ENCOUNTER — Other Ambulatory Visit: Payer: Self-pay | Admitting: *Deleted

## 2013-10-24 ENCOUNTER — Ambulatory Visit (INDEPENDENT_AMBULATORY_CARE_PROVIDER_SITE_OTHER)
Admission: RE | Admit: 2013-10-24 | Discharge: 2013-10-24 | Disposition: A | Discharge status: Home / Self Care | Payer: BC Managed Care – PPO | Source: Ambulatory Visit | Attending: Gastroenterology | Admitting: Gastroenterology

## 2013-10-24 DIAGNOSIS — R109 Unspecified abdominal pain: Secondary | ICD-10-CM

## 2013-10-24 MED ORDER — IOHEXOL 300 MG/ML  SOLN
100.0000 mL | Freq: Once | INTRAMUSCULAR | Status: AC | PRN
  Administered 2013-10-24: 100 mL via INTRAVENOUS

## 2013-11-15 ENCOUNTER — Encounter (HOSPITAL_COMMUNITY): Payer: BC Managed Care – PPO

## 2013-12-20 ENCOUNTER — Other Ambulatory Visit: Payer: Self-pay | Admitting: Obstetrics and Gynecology

## 2013-12-21 LAB — CYTOLOGY - PAP

## 2014-09-03 ENCOUNTER — Encounter: Payer: Self-pay | Admitting: Gastroenterology

## 2014-12-25 ENCOUNTER — Other Ambulatory Visit: Payer: Self-pay | Admitting: Obstetrics and Gynecology

## 2014-12-26 LAB — CYTOLOGY - PAP

## 2015-11-12 DIAGNOSIS — Z1231 Encounter for screening mammogram for malignant neoplasm of breast: Secondary | ICD-10-CM | POA: Diagnosis not present

## 2015-12-18 DIAGNOSIS — M62830 Muscle spasm of back: Secondary | ICD-10-CM | POA: Diagnosis not present

## 2016-05-19 DIAGNOSIS — R609 Edema, unspecified: Secondary | ICD-10-CM | POA: Diagnosis not present

## 2016-05-19 DIAGNOSIS — M654 Radial styloid tenosynovitis [de Quervain]: Secondary | ICD-10-CM | POA: Diagnosis not present

## 2016-06-15 DIAGNOSIS — M654 Radial styloid tenosynovitis [de Quervain]: Secondary | ICD-10-CM | POA: Diagnosis not present

## 2016-09-01 DIAGNOSIS — M25531 Pain in right wrist: Secondary | ICD-10-CM | POA: Diagnosis not present

## 2016-09-01 DIAGNOSIS — M654 Radial styloid tenosynovitis [de Quervain]: Secondary | ICD-10-CM | POA: Diagnosis not present

## 2016-11-13 DIAGNOSIS — Z1231 Encounter for screening mammogram for malignant neoplasm of breast: Secondary | ICD-10-CM | POA: Diagnosis not present

## 2016-11-18 DIAGNOSIS — L821 Other seborrheic keratosis: Secondary | ICD-10-CM | POA: Diagnosis not present

## 2016-11-18 DIAGNOSIS — D220 Melanocytic nevi of lip: Secondary | ICD-10-CM | POA: Diagnosis not present

## 2016-11-18 DIAGNOSIS — D2239 Melanocytic nevi of other parts of face: Secondary | ICD-10-CM | POA: Diagnosis not present

## 2016-11-18 DIAGNOSIS — D1801 Hemangioma of skin and subcutaneous tissue: Secondary | ICD-10-CM | POA: Diagnosis not present

## 2016-11-19 DIAGNOSIS — I10 Essential (primary) hypertension: Secondary | ICD-10-CM | POA: Diagnosis not present

## 2016-11-19 DIAGNOSIS — Z01419 Encounter for gynecological examination (general) (routine) without abnormal findings: Secondary | ICD-10-CM | POA: Diagnosis not present

## 2016-11-19 DIAGNOSIS — Z1212 Encounter for screening for malignant neoplasm of rectum: Secondary | ICD-10-CM | POA: Diagnosis not present

## 2016-11-19 DIAGNOSIS — Z6839 Body mass index (BMI) 39.0-39.9, adult: Secondary | ICD-10-CM | POA: Diagnosis not present

## 2017-06-23 DIAGNOSIS — I10 Essential (primary) hypertension: Secondary | ICD-10-CM | POA: Diagnosis not present

## 2017-06-23 DIAGNOSIS — M654 Radial styloid tenosynovitis [de Quervain]: Secondary | ICD-10-CM | POA: Diagnosis not present

## 2017-06-23 DIAGNOSIS — E042 Nontoxic multinodular goiter: Secondary | ICD-10-CM | POA: Diagnosis not present

## 2017-06-23 DIAGNOSIS — R0781 Pleurodynia: Secondary | ICD-10-CM | POA: Diagnosis not present

## 2017-06-29 ENCOUNTER — Other Ambulatory Visit: Payer: Self-pay | Admitting: Internal Medicine

## 2017-06-29 DIAGNOSIS — R0781 Pleurodynia: Secondary | ICD-10-CM

## 2017-07-14 ENCOUNTER — Other Ambulatory Visit: Payer: Self-pay | Admitting: Internal Medicine

## 2017-07-14 ENCOUNTER — Ambulatory Visit
Admission: RE | Admit: 2017-07-14 | Discharge: 2017-07-14 | Disposition: A | Discharge status: Home / Self Care | Payer: BLUE CROSS/BLUE SHIELD | Source: Ambulatory Visit | Attending: Internal Medicine | Admitting: Internal Medicine

## 2017-07-14 DIAGNOSIS — R109 Unspecified abdominal pain: Secondary | ICD-10-CM | POA: Diagnosis not present

## 2017-07-14 DIAGNOSIS — R0781 Pleurodynia: Secondary | ICD-10-CM | POA: Diagnosis not present

## 2017-07-14 MED ORDER — IOPAMIDOL (ISOVUE-300) INJECTION 61%
75.0000 mL | Freq: Once | INTRAVENOUS | Status: AC | PRN
  Administered 2017-07-14: 75 mL via INTRAVENOUS

## 2017-07-28 DIAGNOSIS — E7849 Other hyperlipidemia: Secondary | ICD-10-CM | POA: Diagnosis not present

## 2017-07-28 DIAGNOSIS — E038 Other specified hypothyroidism: Secondary | ICD-10-CM | POA: Diagnosis not present

## 2017-07-28 DIAGNOSIS — E559 Vitamin D deficiency, unspecified: Secondary | ICD-10-CM | POA: Diagnosis not present

## 2017-11-18 DIAGNOSIS — Z1231 Encounter for screening mammogram for malignant neoplasm of breast: Secondary | ICD-10-CM | POA: Diagnosis not present

## 2017-12-29 DIAGNOSIS — D1801 Hemangioma of skin and subcutaneous tissue: Secondary | ICD-10-CM | POA: Diagnosis not present

## 2017-12-29 DIAGNOSIS — L814 Other melanin hyperpigmentation: Secondary | ICD-10-CM | POA: Diagnosis not present

## 2017-12-29 DIAGNOSIS — L821 Other seborrheic keratosis: Secondary | ICD-10-CM | POA: Diagnosis not present

## 2017-12-29 DIAGNOSIS — D2261 Melanocytic nevi of right upper limb, including shoulder: Secondary | ICD-10-CM | POA: Diagnosis not present

## 2018-01-10 DIAGNOSIS — Z0000 Encounter for general adult medical examination without abnormal findings: Secondary | ICD-10-CM | POA: Diagnosis not present

## 2018-01-10 DIAGNOSIS — E559 Vitamin D deficiency, unspecified: Secondary | ICD-10-CM | POA: Diagnosis not present

## 2018-01-10 DIAGNOSIS — R7309 Other abnormal glucose: Secondary | ICD-10-CM | POA: Diagnosis not present

## 2018-01-10 DIAGNOSIS — R82998 Other abnormal findings in urine: Secondary | ICD-10-CM | POA: Diagnosis not present

## 2018-01-10 DIAGNOSIS — E038 Other specified hypothyroidism: Secondary | ICD-10-CM | POA: Diagnosis not present

## 2018-01-10 DIAGNOSIS — I10 Essential (primary) hypertension: Secondary | ICD-10-CM | POA: Diagnosis not present

## 2018-01-19 DIAGNOSIS — I10 Essential (primary) hypertension: Secondary | ICD-10-CM | POA: Diagnosis not present

## 2018-01-19 DIAGNOSIS — R739 Hyperglycemia, unspecified: Secondary | ICD-10-CM | POA: Diagnosis not present

## 2018-01-19 DIAGNOSIS — G479 Sleep disorder, unspecified: Secondary | ICD-10-CM | POA: Diagnosis not present

## 2018-01-19 DIAGNOSIS — Z1331 Encounter for screening for depression: Secondary | ICD-10-CM | POA: Diagnosis not present

## 2018-01-19 DIAGNOSIS — Z0000 Encounter for general adult medical examination without abnormal findings: Secondary | ICD-10-CM | POA: Diagnosis not present

## 2018-01-19 DIAGNOSIS — E538 Deficiency of other specified B group vitamins: Secondary | ICD-10-CM | POA: Diagnosis not present

## 2018-02-07 DIAGNOSIS — I10 Essential (primary) hypertension: Secondary | ICD-10-CM | POA: Diagnosis not present

## 2018-02-07 DIAGNOSIS — E7849 Other hyperlipidemia: Secondary | ICD-10-CM | POA: Diagnosis not present

## 2018-02-07 DIAGNOSIS — R7309 Other abnormal glucose: Secondary | ICD-10-CM | POA: Diagnosis not present

## 2018-02-07 DIAGNOSIS — E538 Deficiency of other specified B group vitamins: Secondary | ICD-10-CM | POA: Diagnosis not present

## 2018-04-04 DIAGNOSIS — Z01419 Encounter for gynecological examination (general) (routine) without abnormal findings: Secondary | ICD-10-CM | POA: Diagnosis not present

## 2018-04-04 DIAGNOSIS — Z6837 Body mass index (BMI) 37.0-37.9, adult: Secondary | ICD-10-CM | POA: Diagnosis not present

## 2018-04-21 DIAGNOSIS — M7090 Unspecified soft tissue disorder related to use, overuse and pressure of unspecified site: Secondary | ICD-10-CM | POA: Diagnosis not present

## 2018-04-21 DIAGNOSIS — J111 Influenza due to unidentified influenza virus with other respiratory manifestations: Secondary | ICD-10-CM | POA: Diagnosis not present

## 2018-04-21 DIAGNOSIS — R05 Cough: Secondary | ICD-10-CM | POA: Diagnosis not present

## 2018-04-21 DIAGNOSIS — K219 Gastro-esophageal reflux disease without esophagitis: Secondary | ICD-10-CM | POA: Diagnosis not present

## 2018-08-09 DIAGNOSIS — E538 Deficiency of other specified B group vitamins: Secondary | ICD-10-CM | POA: Diagnosis not present

## 2018-08-09 DIAGNOSIS — E669 Obesity, unspecified: Secondary | ICD-10-CM | POA: Diagnosis not present

## 2018-08-09 DIAGNOSIS — R300 Dysuria: Secondary | ICD-10-CM | POA: Diagnosis not present

## 2018-08-09 DIAGNOSIS — E785 Hyperlipidemia, unspecified: Secondary | ICD-10-CM | POA: Diagnosis not present

## 2019-08-24 ENCOUNTER — Other Ambulatory Visit: Payer: Self-pay | Admitting: Obstetrics and Gynecology

## 2019-08-24 DIAGNOSIS — R928 Other abnormal and inconclusive findings on diagnostic imaging of breast: Secondary | ICD-10-CM

## 2019-08-30 ENCOUNTER — Other Ambulatory Visit: Payer: Self-pay

## 2019-08-30 ENCOUNTER — Other Ambulatory Visit: Payer: Self-pay | Admitting: Obstetrics and Gynecology

## 2019-08-30 ENCOUNTER — Ambulatory Visit
Admission: RE | Admit: 2019-08-30 | Discharge: 2019-08-30 | Disposition: A | Discharge status: Home / Self Care | Payer: 59 | Source: Ambulatory Visit | Attending: Obstetrics and Gynecology | Admitting: Obstetrics and Gynecology

## 2019-08-30 DIAGNOSIS — R928 Other abnormal and inconclusive findings on diagnostic imaging of breast: Secondary | ICD-10-CM

## 2019-08-30 DIAGNOSIS — R921 Mammographic calcification found on diagnostic imaging of breast: Secondary | ICD-10-CM

## 2020-04-02 ENCOUNTER — Other Ambulatory Visit: Payer: Self-pay

## 2020-04-02 ENCOUNTER — Other Ambulatory Visit: Payer: Self-pay | Admitting: Obstetrics and Gynecology

## 2020-04-02 ENCOUNTER — Ambulatory Visit
Admission: RE | Admit: 2020-04-02 | Discharge: 2020-04-02 | Disposition: A | Discharge status: Home / Self Care | Payer: 59 | Source: Ambulatory Visit | Attending: Obstetrics and Gynecology | Admitting: Obstetrics and Gynecology

## 2020-04-02 DIAGNOSIS — R921 Mammographic calcification found on diagnostic imaging of breast: Secondary | ICD-10-CM

## 2020-05-29 ENCOUNTER — Ambulatory Visit
Admission: RE | Admit: 2020-05-29 | Discharge: 2020-05-29 | Disposition: A | Discharge status: Home / Self Care | Payer: 59 | Source: Ambulatory Visit | Attending: Obstetrics and Gynecology | Admitting: Obstetrics and Gynecology

## 2020-05-29 ENCOUNTER — Other Ambulatory Visit: Payer: Self-pay | Admitting: Diagnostic Radiology

## 2020-05-29 ENCOUNTER — Other Ambulatory Visit: Payer: Self-pay | Admitting: Obstetrics and Gynecology

## 2020-05-29 DIAGNOSIS — R921 Mammographic calcification found on diagnostic imaging of breast: Secondary | ICD-10-CM

## 2021-07-21 DIAGNOSIS — G629 Polyneuropathy, unspecified: Secondary | ICD-10-CM | POA: Diagnosis not present

## 2021-07-21 DIAGNOSIS — E559 Vitamin D deficiency, unspecified: Secondary | ICD-10-CM | POA: Diagnosis not present

## 2021-07-21 DIAGNOSIS — M329 Systemic lupus erythematosus, unspecified: Secondary | ICD-10-CM | POA: Diagnosis not present

## 2021-07-21 DIAGNOSIS — E039 Hypothyroidism, unspecified: Secondary | ICD-10-CM | POA: Diagnosis not present

## 2021-08-22 DIAGNOSIS — E559 Vitamin D deficiency, unspecified: Secondary | ICD-10-CM | POA: Diagnosis not present

## 2021-08-22 DIAGNOSIS — I10 Essential (primary) hypertension: Secondary | ICD-10-CM | POA: Diagnosis not present

## 2021-08-22 DIAGNOSIS — E669 Obesity, unspecified: Secondary | ICD-10-CM | POA: Diagnosis not present

## 2021-08-22 DIAGNOSIS — E039 Hypothyroidism, unspecified: Secondary | ICD-10-CM | POA: Diagnosis not present

## 2021-11-20 DIAGNOSIS — E559 Vitamin D deficiency, unspecified: Secondary | ICD-10-CM | POA: Diagnosis not present

## 2021-11-20 DIAGNOSIS — E039 Hypothyroidism, unspecified: Secondary | ICD-10-CM | POA: Diagnosis not present

## 2021-11-20 DIAGNOSIS — K9041 Non-celiac gluten sensitivity: Secondary | ICD-10-CM | POA: Diagnosis not present

## 2021-11-20 DIAGNOSIS — E669 Obesity, unspecified: Secondary | ICD-10-CM | POA: Diagnosis not present

## 2022-01-20 DIAGNOSIS — E8881 Metabolic syndrome: Secondary | ICD-10-CM | POA: Diagnosis not present

## 2022-01-20 DIAGNOSIS — E039 Hypothyroidism, unspecified: Secondary | ICD-10-CM | POA: Diagnosis not present

## 2022-02-02 DIAGNOSIS — E669 Obesity, unspecified: Secondary | ICD-10-CM | POA: Diagnosis not present

## 2022-02-02 DIAGNOSIS — E559 Vitamin D deficiency, unspecified: Secondary | ICD-10-CM | POA: Diagnosis not present

## 2022-02-02 DIAGNOSIS — I10 Essential (primary) hypertension: Secondary | ICD-10-CM | POA: Diagnosis not present

## 2022-02-02 DIAGNOSIS — E039 Hypothyroidism, unspecified: Secondary | ICD-10-CM | POA: Diagnosis not present

## 2022-02-25 DIAGNOSIS — M5416 Radiculopathy, lumbar region: Secondary | ICD-10-CM | POA: Diagnosis not present

## 2022-02-26 ENCOUNTER — Other Ambulatory Visit: Payer: Self-pay | Admitting: Adult Health

## 2022-02-26 ENCOUNTER — Other Ambulatory Visit (HOSPITAL_COMMUNITY): Payer: Self-pay | Admitting: Adult Health

## 2022-02-26 DIAGNOSIS — M5416 Radiculopathy, lumbar region: Secondary | ICD-10-CM

## 2022-02-27 ENCOUNTER — Other Ambulatory Visit: Payer: 59

## 2022-03-01 ENCOUNTER — Ambulatory Visit (HOSPITAL_COMMUNITY): Payer: BC Managed Care – PPO

## 2022-03-04 DIAGNOSIS — E559 Vitamin D deficiency, unspecified: Secondary | ICD-10-CM | POA: Diagnosis not present

## 2022-03-04 DIAGNOSIS — E039 Hypothyroidism, unspecified: Secondary | ICD-10-CM | POA: Diagnosis not present

## 2022-03-04 DIAGNOSIS — E538 Deficiency of other specified B group vitamins: Secondary | ICD-10-CM | POA: Diagnosis not present

## 2022-03-04 DIAGNOSIS — E785 Hyperlipidemia, unspecified: Secondary | ICD-10-CM | POA: Diagnosis not present

## 2022-03-04 DIAGNOSIS — R739 Hyperglycemia, unspecified: Secondary | ICD-10-CM | POA: Diagnosis not present

## 2022-03-05 ENCOUNTER — Ambulatory Visit (HOSPITAL_COMMUNITY)
Admission: RE | Admit: 2022-03-05 | Discharge: 2022-03-05 | Disposition: A | Discharge status: Home / Self Care | Payer: BC Managed Care – PPO | Source: Ambulatory Visit | Attending: Adult Health | Admitting: Adult Health

## 2022-03-05 DIAGNOSIS — M545 Low back pain, unspecified: Secondary | ICD-10-CM | POA: Diagnosis not present

## 2022-03-05 DIAGNOSIS — M5416 Radiculopathy, lumbar region: Secondary | ICD-10-CM | POA: Insufficient documentation

## 2022-03-05 DIAGNOSIS — M4316 Spondylolisthesis, lumbar region: Secondary | ICD-10-CM | POA: Diagnosis not present

## 2022-03-11 DIAGNOSIS — Z1331 Encounter for screening for depression: Secondary | ICD-10-CM | POA: Diagnosis not present

## 2022-03-11 DIAGNOSIS — Z0000 Encounter for general adult medical examination without abnormal findings: Secondary | ICD-10-CM | POA: Diagnosis not present

## 2022-03-11 DIAGNOSIS — Z1339 Encounter for screening examination for other mental health and behavioral disorders: Secondary | ICD-10-CM | POA: Diagnosis not present

## 2022-03-11 DIAGNOSIS — I10 Essential (primary) hypertension: Secondary | ICD-10-CM | POA: Diagnosis not present

## 2022-07-02 DIAGNOSIS — Z1231 Encounter for screening mammogram for malignant neoplasm of breast: Secondary | ICD-10-CM | POA: Diagnosis not present

## 2022-07-02 DIAGNOSIS — Z6839 Body mass index (BMI) 39.0-39.9, adult: Secondary | ICD-10-CM | POA: Diagnosis not present

## 2022-07-02 DIAGNOSIS — Z01419 Encounter for gynecological examination (general) (routine) without abnormal findings: Secondary | ICD-10-CM | POA: Diagnosis not present

## 2022-07-29 ENCOUNTER — Encounter: Payer: Self-pay | Admitting: Neurology

## 2022-07-29 ENCOUNTER — Ambulatory Visit (INDEPENDENT_AMBULATORY_CARE_PROVIDER_SITE_OTHER): Payer: BC Managed Care – PPO | Admitting: Neurology

## 2022-07-29 VITALS — BP 147/83 | HR 70 | Ht 66.0 in | Wt 244.0 lb

## 2022-07-29 DIAGNOSIS — M5417 Radiculopathy, lumbosacral region: Secondary | ICD-10-CM | POA: Diagnosis not present

## 2022-07-29 DIAGNOSIS — R200 Anesthesia of skin: Secondary | ICD-10-CM

## 2022-07-29 DIAGNOSIS — R29898 Other symptoms and signs involving the musculoskeletal system: Secondary | ICD-10-CM | POA: Diagnosis not present

## 2022-07-29 DIAGNOSIS — M545 Low back pain, unspecified: Secondary | ICD-10-CM | POA: Diagnosis not present

## 2022-07-29 DIAGNOSIS — G8929 Other chronic pain: Secondary | ICD-10-CM | POA: Diagnosis not present

## 2022-07-29 NOTE — Patient Instructions (Addendum)
Emg/ncs  Epidural steroid injection L4 and SI joints and see if it helps and lateral femoral cutaneous nerve injections(meralgia paresthetica) Next:  CT Myelogram lumbar spine - another image modality to look better at the spine SI joints? Consider MRI pelvis What could it be? nerve root pinching from the back vs meralgia paresthetica vs SI joint arthritis           Myelogram  A myelogram is an imaging test of the spinal cord and the places where nerves attach to the spinal cord (nerve roots). A dye (contrast material) is injected into the spine before the X-ray. This provides a clearer image for your health care provider. You may need this test if you have a spinal cord problem that cannot be diagnosed with other imaging tests, such as a CT scan or an MRI. You may also have this test to check your spine after surgery or to check for a leak of the fluid that surrounds and protects the spinal cord (cerebrospinalfluid, or CSF). Tell a health care provider about: Any allergies you have, especially to iodine. All medicines you are taking, including vitamins, herbs, eye drops, creams, and over-the-counter medicines. Any problems you or family members have had with anesthesia or contrast materials. Any bleeding problems you have. Any surgeries you have had. Any medical conditions you have or have had, including asthma. Whether you are pregnant or may be pregnant. What are the risks? Your health care provider will talk with you about risks. These may include: Infection. Bleeding. Allergic reactions to medicines or contrast materials. Damage to your spinal cord or nerves. Loss or leaking of CSF. This can lead to headaches. Damage to kidneys. Seizures. This is rare. What happens before the procedure? Follow instructions from your health care provider about what you may eat and drink. You may be asked to drink more fluids. If you will be going home right after the procedure, plan to have  a responsible adult: Take you home from the hospital or clinic. You will not be allowed to drive. Care for you for the time you are told. Medicines Ask your health care provider about: Changing or stopping your regular medicines. This is especially important if you are taking diabetes medicines or blood thinners. Taking medicines such as aspirin and ibuprofen. These medicines can thin your blood. Do not take these medicines unless your health care provider tells you to take them. Taking over-the-counter medicines, vitamins, herbs, and supplements. What happens during the procedure? You will lie face-down on a table. Your health care provider will locate the best injection site on your spine. This is most often in the lower back. The area of injection will be washed with germ-killing soap. You will be given anesthesia. This will numb the area. Your health care provider will insert a long needle into the space around your spinal cord (subarachnoid space). A sample of CSF may be taken and sent to the lab for testing. The contrast material will be injected into the subarachnoid space. The exam table may be tilted to help the contrast material flow up or down your spine. Images of your spinal cord will be taken with X-ray for your health care provider to examine. A bandage (dressing) may be placed over the injection site. The procedure may vary among health care providers and hospitals. What can I expect after the procedure? Your blood pressure, heart rate, breathing rate, and blood oxygen level may be monitored until you leave the hospital or clinic. You may  have: Soreness at your injection site. A mild headache. You will be asked to lie down with your head raised (elevated). This reduces the risk of a headache. It is up to you to get the results of your procedure. Ask your health care provider, or the department that is doing the procedure, when your results will be ready. Follow these  instructions at home: Rest as told by your health care provider. Lie flat with your head slightly elevated to reduce the risk of a headache. Do not bend, lift, or do hard work for 24-48 hours, or as told by your health care provider. Take over-the-counter and prescription medicines only as told by your health care provider. Take care of your dressing as told by your health care provider. Drink enough fluid to keep your pee (urine) pale yellow. Bathe or shower as told by your health care provider. Contact a health care provider if: You have new symptoms or your symptoms get worse. You have a headache that lasts longer than 24 hours. You have a fever. You feel nauseous or vomit. You have a stiff neck or have numbness in your legs. You develop a rash, itching, or sneezing. Get help right away if: You have a seizure. You have trouble breathing. You are unable to urinate or have a bowel movement. These symptoms may be an emergency. Get help right away. Call 911. Do not wait to see if the symptoms will go away. Do not drive yourself to the hospital. This information is not intended to replace advice given to you by your health care provider. Make sure you discuss any questions you have with your health care provider. Document Revised: 11/12/2021 Document Reviewed: 11/12/2021 Elsevier Patient Education  2023 [place] is a test to check how well your muscles and nerves are working. This procedure includes the combined use of electromyogram (EMG) and nerve conduction study (NCS). EMG is used to evaluate muscles and the nerves that control those muscles. NCS, which is also called electroneurogram, measures how well your nerves conduct electricity. The procedures should be done together to check if your muscles and nerves are healthy. If the results of the tests are abnormal, this may indicate disease or injury, such as a neuromuscular disease or  peripheral nerve damage. Tell a health care provider about: Any allergies you have. All medicines you are taking, including vitamins, herbs, eye drops, creams, and over-the-counter medicines. Any bleeding problems you have. Any surgeries you have had. Any medical conditions you have. What are the risks? Generally, this is a safe procedure. However, problems may occur, including: Bleeding or bruising. Infection where the electrodes were inserted. What happens before the test? Medicines Take all of your usually prescribed medications before this testing is performed. Do not stop your blood thinners unless advised by your prescribing physician. General instructions Your health care provider may ask you to warm the limb that will be checked with warm water, hot pack, or wrapping the limb in a blanket. Do not use lotions or creams on the same day that you will be having the procedure. What happens during the test? For EMG  Your health care provider will ask you to stay in a position so that the muscle being studied can be accessed. You will be sitting or lying down. You may be given a medicine to numb the area (local anesthetic) and the skin will be disinfected. A very thin needle that has an electrode will be inserted into  your muscle, one muscle at a time. Typically, multiple muscles are evaluated during a single study. Another small electrode will be placed on your skin near the muscle. Your health care provider will ask you to continue to remain still. The electrodes will record the electrical activity of your muscles. You may see this on a monitor or hear it in the room. After your muscles have been studied at rest, your health care provider will ask you to contract or flex your muscles. The electrodes will record the electrical activity of your muscles. Your health care provider will remove the electrodes and the electrode needle when the procedure is finished. The procedure may vary  among health care providers and hospitals. For NCS  An electrode that records your nerve activity (recording electrode) will be placed on your skin by the muscle that is being studied. An electrode that is used as a reference (reference electrode) will be placed near the recording electrode. A paste or gel will be applied to your skin between the recording electrode and the reference electrode. Your nerve will be stimulated with a mild shock. The speed of the nerves and strength of response is recorded by the electrodes. Your health care provider will remove the electrodes and the gel when the procedure is finished. The procedure may vary among health care providers and hospitals. What can I expect after the test? It is up to you to get your test results. Ask your health care provider, or the department that is doing the test, when your results will be ready. Your health care provider may: Give you medicines for any pain. Monitor the insertion sites to make sure that bleeding stops. You should be able to drive yourself to and from the test. Discomfort can persist for a few hours after the test, but should be better the next day. Contact a health care provider if: You have swelling, redness, or drainage at any of the insertion sites. Summary Electromyoneurogram is a test to check how well your muscles and nerves are working. If the results of the tests are abnormal, this may indicate disease or injury. This is a safe procedure. However, problems may occur, such as bleeding and infection. Your health care provider will do two tests to complete this procedure. One checks your muscles (EMG) and another checks your nerves (NCS). It is up to you to get your test results. Ask your health care provider, or the department that is doing the test, when your results will be ready. This information is not intended to replace advice given to you by your health care provider. Make sure you discuss any  questions you have with your health care provider. Document Revised: 01/01/2021 Document Reviewed: 12/01/2020 Elsevier Patient Education  [place].

## 2022-07-29 NOTE — Progress Notes (Addendum)
[id number]    Provider:  Dr Jaynee Eagles Requesting Provider: Burnard Bunting, MD Primary Care Provider:  Burnard Bunting, MD  CC:  leg pain  HPI:  Emily Carroll is a 58 y.o. female here as requested by Burnard Bunting, MD for leg pain. has HYPOTHYROIDISM; B12 DEFICIENCY; DIVERTICULOSIS, COLON; ABDOMINAL PAIN, LEFT LOWER QUADRANT; Anal and rectal polyp; Special screening for malignant neoplasms, colon; Benign neoplasm of colon; S/P partial resection of colon; Right flank pain; Abdominal pain, right upper quadrant; Chronic bilateral low back pain without sciatica; Numbness of left anterior thigh; and Left leg weakness on their problem list.  Started a year ago in March. Lateral thigh stinging. She has low back pain. But doesn't come from the low back maybe, unclear, she does have low back pain. Getting worse as far as spreading, but less sensitive to touch less pain more numbness and better with b12 supplementation. She has weakness in the left leg. No shooting pain. Sitting doesn't hurt but hurts when she walks after 500 feet and she is excruciating in the low back so bad that it brings tears to her eyes. Weakness is more proximal. No falls. She has gained about 40 pounds. Feels her abdomen is getting bigger and pulling. Worsening back pain, can't walk for long before sitting down, bending over a cart helps.   Reviewed notes, labs and imaging from outside physicians, which showed:  03/09/2022: FINDINGS: Segmentation:  Standard.   Alignment: Minimal grade 1 anterolisthesis of L4 on L5 secondary to facet disease.   Vertebrae: No acute fracture, evidence of discitis, or aggressive bone lesion.   Conus medullaris and cauda equina: Conus extends to the L1 level. Conus and cauda equina appear normal.   Paraspinal and other soft tissues: No acute paraspinal abnormality.   Disc levels:   Disc spaces: Disc desiccation at L3-4, L4-5 and L5-S1. Minimal disc height loss at  L5-S1.   T12-L1: No significant disc bulge. No neural foraminal stenosis. No central canal stenosis.   L1-L2: No significant disc bulge. No neural foraminal stenosis. No central canal stenosis.   L2-L3: No significant disc bulge. No neural foraminal stenosis. No central canal stenosis.   L3-L4: No significant disc bulge. No neural foraminal stenosis. No central canal stenosis. Mild bilateral facet arthropathy.   L4-L5: Moderate bilateral facet arthropathy, left greater than right. Mild broad-based disc bulge. No foraminal or central canal stenosis.   L5-S1: No significant disc bulge. No neural foraminal stenosis. No central canal stenosis. Mild bilateral facet arthropathy.   IMPRESSION: 1. At L4-5 there is moderate bilateral facet arthropathy, left greater than right. Mild broad-based disc bulge. No foraminal or central canal stenosis. 2. At L3-4 and L5-S1 there is mild bilateral facet arthropathy. 3. No acute osseous injury of the lumbar spine.     Latest Ref Rng & Units 03/19/2009    9:50 AM 02/24/2007    4:30 AM 02/21/2007    8:00 AM  CBC  WBC 4.5 - 10.5 10*3/microliter 6.0  13.0  7.6   Hemoglobin 12.0 - 15.0 g/dL 12.7  11.4  13.6   Hematocrit 36.0 - 46.0 % 36.5  32.9  39.5   Platelets 150.0 - 400.0 K/uL 208.0  230  241       02/21/2007    8:00 AM  CMP  Glucose 112   BUN 15   Creatinine 0.76   Sodium 139   Potassium 3.9   Chloride 105   CO2 27   Calcium 8.9  Total Protein 6.8   Total Bilirubin 0.9   Alkaline Phos 61   AST 14   ALT 14      Review of Systems: Patient complains of symptoms per HPI as well as the following symptoms leg pain. Pertinent negatives and positives per HPI. All others negative.   Social History   Socioeconomic History   Marital status: Married    Spouse name: Not on file   Number of children: 0   Years of education: Not on file   Highest education level: Not on file  Occupational History   Occupation: Print production planner: [employer]  Tobacco Use   Smoking status: Never   Smokeless tobacco: Never  Substance and Sexual Activity   Alcohol use: No   Drug use: No   Sexual activity: Not on file  Other Topics Concern   Not on file  Social History Narrative   Not on file   Social Determinants of Health   Financial Resource Strain: Not on file  Food Insecurity: Not on file  Transportation Needs: Not on file  Physical Activity: Not on file  Stress: Not on file  Social Connections: Not on file  Intimate Partner Violence: Not on file    Family History  Problem Relation Age of Onset   Diabetes Maternal Grandmother    Colon polyps Maternal Aunt     Past Medical History:  Diagnosis Date   Abdominal pain, left lower quadrant    Anal polyp    Colon polyps    Diverticulitis 2008   Diverticulosis of colon (without mention of hemorrhage)    Duodenitis without mention of hemorrhage    Hyperlipidemia    Unspecified hypothyroidism    Vitamin B12 deficiency     Patient Active Problem List   Diagnosis Date Noted   Chronic bilateral low back pain without sciatica 07/30/2022   Numbness of left anterior thigh 07/30/2022   Left leg weakness 07/30/2022   Right flank pain 10/11/2013   Abdominal pain, right upper quadrant 10/11/2013   Anal and rectal polyp 12/29/2010   Special screening for malignant neoplasms, colon 12/29/2010   Benign neoplasm of colon 12/29/2010   S/P partial resection of colon 12/29/2010   HYPOTHYROIDISM 04/15/2009   B12 DEFICIENCY 04/15/2009   DIVERTICULOSIS, COLON 03/19/2009   ABDOMINAL PAIN, LEFT LOWER QUADRANT 03/19/2009    Past Surgical History:  Procedure Laterality Date   ABDOMINAL HYSTERECTOMY  2008   COLON RESECTION  2008   COLONOSCOPY  2012   repeat 10 yrs    ESOPHAGOGASTRODUODENOSCOPY  2007   normal     Current Outpatient Medications  Medication Sig Dispense Refill   Alpha-Lipoic Acid (LIPOIC ACID PO) Take by mouth.     ARMOUR THYROID PO Take 30 mg by  mouth. Pt takes Monday - Saturday     BIOTIN 5000 PO Take by mouth.     MAGNESIUM PO Take by mouth.     Methylcobalamin (METHYL B-12 PO) Take by mouth.     Misc Natural Products (JOINT HEALTH PO) Take by mouth.     telmisartan (MICARDIS) 40 MG tablet Take 1 tablet by mouth daily.     thyroid (ARMOUR) 240 MG tablet Take 120 mg by mouth daily. Take 1 tablet x 5 days and 1/2 tablet on the 6th day     UNABLE TO FIND 1,500 mg. Med Name: Tumeric     UNABLE TO FIND Med Name: Gluten ease     UNABLE TO FIND  Med Name: leptin  detox     UNABLE TO FIND Med Name: thyroid support     No current facility-administered medications for this visit.    Allergies as of 07/29/2022   (No Known Allergies)    Vitals: BP (!) 147/83   Pulse 70   Ht 5\' 6"  (1.676 m)   Wt 244 lb (110.7 kg)   BMI 39.38 kg/m  Last Weight:  Wt Readings from Last 1 Encounters:  07/29/22 244 lb (110.7 kg)   Last Height:   Ht Readings from Last 1 Encounters:  07/29/22 5\' 6"  (1.676 m)     Physical exam: Exam: Gen: NAD, conversant, well nourised, obese, well groomed                     CV: RRR, no MRG. No Carotid Bruits. No peripheral edema, warm, nontender Eyes: Conjunctivae clear without exudates or hemorrhage  Neuro: Detailed Neurologic Exam  Speech:    Speech is normal; fluent and spontaneous with normal comprehension.  Cognition:    The patient is oriented to person, place, and time;     recent and remote memory intact;     language fluent;     normal attention, concentration,     fund of knowledge Cranial Nerves:    The pupils are equal, round, and reactive to light. The fundi are normal and spontaneous venous pulsations are present. Visual fields are full to finger confrontation. Extraocular movements are intact. Trigeminal sensation is intact and the muscles of mastication are normal. The face is symmetric. The palate elevates in the midline. Hearing intact. Voice is normal. Shoulder shrug is normal. The  tongue has normal motion without fasciculations.   Coordination:    Normal finger to nose and heel to shin. Normal rapid alternating movements.   Gait:    Heel-toe and tandem gait are normal.   Motor Observation:    No asymmetry, no atrophy, and no involuntary movements noted. Tone:    Normal muscle tone.    Posture:    Posture is normal. normal erect    Strength: weakness left prox thigh 4/5 otherwise strength is V/V in the upper and lower limbs.      Sensation: intact to LT     Reflex Exam:  DTR's:    Left patellar hypo as compared to the right  Toes:    The toes are downgoing bilaterally.   Clonus:    Clonus is absent.    Assessment/Plan:  Differential nerve root pinching from the back appears to be more L4 left vs meralgia paresthetica vs SI joint arthritis  Mri was largely unremarkable but on exam Left patellar hypo as compared to the right and left hip flexion weakness fit l3/l4, back pain out of proportion to  mri lumbar spine  Emg/ncs  Epidural steroid injection L4 then if that doesn't help can order injections into SI joints and see if it helps and lateral femoral cutaneous nerve injections(meralgia paresthetica) Next:  CT Myelogram lumbar spine - another image modality to look better at the spine SI joints? Consider MRI pelvis  Orders Placed This Encounter  Procedures   DG INJECT DIAG/THERA/INC NEEDLE/CATH/PLC EPI/LUMB/SAC W/IMG   NCV with EMG(electromyography)            Myelogram  A myelogram is an imaging test of the spinal cord and the places where nerves attach to the spinal cord (nerve roots). A dye (contrast material) is injected into the spine before the X-ray. This  provides a clearer image for your health care provider. You may need this test if you have a spinal cord problem that cannot be diagnosed with other imaging tests, such as a CT scan or an MRI. You may also have this test to check your spine after surgery or to check for a leak of  the fluid that surrounds and protects the spinal cord (cerebrospinalfluid, or CSF). Tell a health care provider about: Any allergies you have, especially to iodine. All medicines you are taking, including vitamins, herbs, eye drops, creams, and over-the-counter medicines. Any problems you or family members have had with anesthesia or contrast materials. Any bleeding problems you have. Any surgeries you have had. Any medical conditions you have or have had, including asthma. Whether you are pregnant or may be pregnant. What are the risks? Your health care provider will talk with you about risks. These may include: Infection. Bleeding. Allergic reactions to medicines or contrast materials. Damage to your spinal cord or nerves. Loss or leaking of CSF. This can lead to headaches. Damage to kidneys. Seizures. This is rare. What happens before the procedure? Follow instructions from your health care provider about what you may eat and drink. You may be asked to drink more fluids. If you will be going home right after the procedure, plan to have a responsible adult: Take you home from the hospital or clinic. You will not be allowed to drive. Care for you for the time you are told. Medicines Ask your health care provider about: Changing or stopping your regular medicines. This is especially important if you are taking diabetes medicines or blood thinners. Taking medicines such as aspirin and ibuprofen. These medicines can thin your blood. Do not take these medicines unless your health care provider tells you to take them. Taking over-the-counter medicines, vitamins, herbs, and supplements. What happens during the procedure? You will lie face-down on a table. Your health care provider will locate the best injection site on your spine. This is most often in the lower back. The area of injection will be washed with germ-killing soap. You will be given anesthesia. This will numb the area. Your  health care provider will insert a long needle into the space around your spinal cord (subarachnoid space). A sample of CSF may be taken and sent to the lab for testing. The contrast material will be injected into the subarachnoid space. The exam table may be tilted to help the contrast material flow up or down your spine. Images of your spinal cord will be taken with X-ray for your health care provider to examine. A bandage (dressing) may be placed over the injection site. The procedure may vary among health care providers and hospitals. What can I expect after the procedure? Your blood pressure, heart rate, breathing rate, and blood oxygen level may be monitored until you leave the hospital or clinic. You may have: Soreness at your injection site. A mild headache. You will be asked to lie down with your head raised (elevated). This reduces the risk of a headache. It is up to you to get the results of your procedure. Ask your health care provider, or the department that is doing the procedure, when your results will be ready. Follow these instructions at home: Rest as told by your health care provider. Lie flat with your head slightly elevated to reduce the risk of a headache. Do not bend, lift, or do hard work for 24-48 hours, or as told by your health care provider.  Take over-the-counter and prescription medicines only as told by your health care provider. Take care of your dressing as told by your health care provider. Drink enough fluid to keep your pee (urine) pale yellow. Bathe or shower as told by your health care provider. Contact a health care provider if: You have new symptoms or your symptoms get worse. You have a headache that lasts longer than 24 hours. You have a fever. You feel nauseous or vomit. You have a stiff neck or have numbness in your legs. You develop a rash, itching, or sneezing. Get help right away if: You have a seizure. You have trouble breathing. You are  unable to urinate or have a bowel movement. These symptoms may be an emergency. Get help right away. Call 911. Do not wait to see if the symptoms will go away. Do not drive yourself to the hospital. This information is not intended to replace advice given to you by your health care provider. Make sure you discuss any questions you have with your health care provider. Document Revised: 11/12/2021 Document Reviewed: 11/12/2021 Elsevier Patient Education  2023 [place] is a test to check how well your muscles and nerves are working. This procedure includes the combined use of electromyogram (EMG) and nerve conduction study (NCS). EMG is used to evaluate muscles and the nerves that control those muscles. NCS, which is also called electroneurogram, measures how well your nerves conduct electricity. The procedures should be done together to check if your muscles and nerves are healthy. If the results of the tests are abnormal, this may indicate disease or injury, such as a neuromuscular disease or peripheral nerve damage. Tell a health care provider about: Any allergies you have. All medicines you are taking, including vitamins, herbs, eye drops, creams, and over-the-counter medicines. Any bleeding problems you have. Any surgeries you have had. Any medical conditions you have. What are the risks? Generally, this is a safe procedure. However, problems may occur, including: Bleeding or bruising. Infection where the electrodes were inserted. What happens before the test? Medicines Take all of your usually prescribed medications before this testing is performed. Do not stop your blood thinners unless advised by your prescribing physician. General instructions Your health care provider may ask you to warm the limb that will be checked with warm water, hot pack, or wrapping the limb in a blanket. Do not use lotions or creams on the same day that you  will be having the procedure. What happens during the test? For EMG  Your health care provider will ask you to stay in a position so that the muscle being studied can be accessed. You will be sitting or lying down. You may be given a medicine to numb the area (local anesthetic) and the skin will be disinfected. A very thin needle that has an electrode will be inserted into your muscle, one muscle at a time. Typically, multiple muscles are evaluated during a single study. Another small electrode will be placed on your skin near the muscle. Your health care provider will ask you to continue to remain still. The electrodes will record the electrical activity of your muscles. You may see this on a monitor or hear it in the room. After your muscles have been studied at rest, your health care provider will ask you to contract or flex your muscles. The electrodes will record the electrical activity of your muscles. Your health care provider will remove the electrodes and the electrode  needle when the procedure is finished. The procedure may vary among health care providers and hospitals. For NCS  An electrode that records your nerve activity (recording electrode) will be placed on your skin by the muscle that is being studied. An electrode that is used as a reference (reference electrode) will be placed near the recording electrode. A paste or gel will be applied to your skin between the recording electrode and the reference electrode. Your nerve will be stimulated with a mild shock. The speed of the nerves and strength of response is recorded by the electrodes. Your health care provider will remove the electrodes and the gel when the procedure is finished. The procedure may vary among health care providers and hospitals. What can I expect after the test? It is up to you to get your test results. Ask your health care provider, or the department that is doing the test, when your results will be  ready. Your health care provider may: Give you medicines for any pain. Monitor the insertion sites to make sure that bleeding stops. You should be able to drive yourself to and from the test. Discomfort can persist for a few hours after the test, but should be better the next day. Contact a health care provider if: You have swelling, redness, or drainage at any of the insertion sites. Summary Electromyoneurogram is a test to check how well your muscles and nerves are working. If the results of the tests are abnormal, this may indicate disease or injury. This is a safe procedure. However, problems may occur, such as bleeding and infection. Your health care provider will do two tests to complete this procedure. One checks your muscles (EMG) and another checks your nerves (NCS). It is up to you to get your test results. Ask your health care provider, or the department that is doing the test, when your results will be ready. This information is not intended to replace advice given to you by your health care provider. Make sure you discuss any questions you have with your health care provider. Document Revised: 01/01/2021 Document Reviewed: 12/01/2020 Elsevier Patient Education  [place].   Lateral femoral cutaneous nerve impingement but can't rule out not coming from the back.      Cc: Burnard Bunting, MD,  Burnard Bunting, MD  Sarina Ill, MD  [facility] [address] [place], [place] [address]  Phone [phone] Fax [phone]  I spent over 80 minutes of face-to-face and non-face-to-face time with patient on the  1. Chronic bilateral low back pain without sciatica   2. Numbness of left anterior thigh   3. Left leg weakness   4. Lumbosacral radiculopathy at L4    diagnosis.  This included previsit chart review, lab review, study review, order entry, electronic health record documentation, patient education on the different  diagnostic and therapeutic options, counseling and coordination of care, risks and benefits of management, compliance, or risk factor reduction

## 2022-07-30 DIAGNOSIS — R200 Anesthesia of skin: Secondary | ICD-10-CM | POA: Insufficient documentation

## 2022-07-30 DIAGNOSIS — R29898 Other symptoms and signs involving the musculoskeletal system: Secondary | ICD-10-CM | POA: Insufficient documentation

## 2022-07-30 DIAGNOSIS — M545 Low back pain, unspecified: Secondary | ICD-10-CM | POA: Insufficient documentation

## 2022-08-03 NOTE — Addendum Note (Signed)
Addended by: Sarina Ill B on: 08/03/2022 01:39 PM   Modules accepted: Orders

## 2022-08-04 DIAGNOSIS — E88819 Insulin resistance, unspecified: Secondary | ICD-10-CM | POA: Diagnosis not present

## 2022-08-04 DIAGNOSIS — E559 Vitamin D deficiency, unspecified: Secondary | ICD-10-CM | POA: Diagnosis not present

## 2022-08-04 DIAGNOSIS — R7301 Impaired fasting glucose: Secondary | ICD-10-CM | POA: Diagnosis not present

## 2022-08-04 DIAGNOSIS — E039 Hypothyroidism, unspecified: Secondary | ICD-10-CM | POA: Diagnosis not present

## 2022-08-11 DIAGNOSIS — E559 Vitamin D deficiency, unspecified: Secondary | ICD-10-CM | POA: Diagnosis not present

## 2022-08-11 DIAGNOSIS — E669 Obesity, unspecified: Secondary | ICD-10-CM | POA: Diagnosis not present

## 2022-08-11 DIAGNOSIS — I10 Essential (primary) hypertension: Secondary | ICD-10-CM | POA: Diagnosis not present

## 2022-08-11 DIAGNOSIS — E039 Hypothyroidism, unspecified: Secondary | ICD-10-CM | POA: Diagnosis not present

## 2022-08-25 ENCOUNTER — Ambulatory Visit
Admission: RE | Admit: 2022-08-25 | Discharge: 2022-08-25 | Disposition: A | Discharge status: Home / Self Care | Payer: BC Managed Care – PPO | Source: Ambulatory Visit | Attending: Neurology | Admitting: Neurology

## 2022-08-25 DIAGNOSIS — M47816 Spondylosis without myelopathy or radiculopathy, lumbar region: Secondary | ICD-10-CM | POA: Diagnosis not present

## 2022-08-25 DIAGNOSIS — M48061 Spinal stenosis, lumbar region without neurogenic claudication: Secondary | ICD-10-CM | POA: Diagnosis not present

## 2022-08-25 DIAGNOSIS — M5417 Radiculopathy, lumbosacral region: Secondary | ICD-10-CM

## 2022-08-25 MED ORDER — METHYLPREDNISOLONE ACETATE 40 MG/ML INJ SUSP (RADIOLOG
80.0000 mg | Freq: Once | INTRAMUSCULAR | Status: AC
  Administered 2022-08-25: 80 mg via EPIDURAL

## 2022-08-25 MED ORDER — IOPAMIDOL (ISOVUE-M 200) INJECTION 41%
1.0000 mL | Freq: Once | INTRAMUSCULAR | Status: AC
  Administered 2022-08-25: 1 mL via EPIDURAL

## 2022-08-25 NOTE — Discharge Instructions (Signed)

## 2022-09-22 DIAGNOSIS — E7800 Pure hypercholesterolemia, unspecified: Secondary | ICD-10-CM | POA: Diagnosis not present

## 2022-09-22 DIAGNOSIS — E039 Hypothyroidism, unspecified: Secondary | ICD-10-CM | POA: Diagnosis not present

## 2022-09-30 ENCOUNTER — Telehealth: Payer: Self-pay | Admitting: Neurology

## 2022-09-30 NOTE — Telephone Encounter (Signed)
I tried to give the patient a call to get more info but had LVM. No DPR on file. I did leave our phone number. If she has urgent concerns that cannot wait until tomorrow she can call our after-hours provider for advice. If emergency symptoms, unable to move well, numbness, tingling, etc, then go to ER. Will try to reach pt again tomorrow morning.

## 2022-09-30 NOTE — Telephone Encounter (Signed)
Patient had a steroid injection at [facility] 4/23. She expected it to work differently so after the shot now she has pain and tightness is bottom part of leg and arms and hands since shot. She is wanting to discuss because not sure if this is normal. She was also told she can have more injections within the 6 months but she is concerned and nervous.

## 2022-10-01 NOTE — Telephone Encounter (Signed)
Tried to reach the patient a second time. I LVM with our office number and hours today for call back.

## 2022-10-08 ENCOUNTER — Encounter: Payer: BC Managed Care – PPO | Admitting: Neurology

## 2022-10-12 NOTE — Telephone Encounter (Signed)
I called the patient once more to try to see if I could more information. I LVM with office number and hours offering for her to call us back. Hope she's doing well.

## 2022-10-28 ENCOUNTER — Telehealth: Payer: Self-pay | Admitting: Gastroenterology

## 2022-10-28 NOTE — Telephone Encounter (Signed)
Inbound call from patient stating that she has been having right sided pain under the rib area that is now causing pain in her back. She states is been on and off for a year but now it is consistent. Patient states she was seen by her PCP and they sent her for imaging and at the time nothing showed up. Patient states she is having nausea, ringing in ears and having pale brown stools for the last few weeks. Patient wanted to see Dr. Rhea Belton and was advised that he is booking in November. Patient requesting a call back to discuss.

## 2022-10-28 NOTE — Telephone Encounter (Signed)
Spoke with pt and she tells me that her sister was diagnosed with bile duct cancer recently and she is concerned because she is having pain on the right side of the abd under the rib.  She has back pain and nausea with ringing in the ears as well as pale stools.  She was last seen in 2015 with Dr Jarold Motto.  She prefers to see Dr Rhea Belton.  I have offered Dr Rhea Belton first available which is not until October.  I offered to look at all providers to see who has a sooner appt but the pt became very frustrated and said never mind and  hung up.

## 2022-11-07 IMAGING — MG MM BREAST LOCALIZATION CLIP
4 series · 4 of 12 positions shown · non-contrast
Comparison: Previous exam(s).

CLINICAL DATA: Patient status post stereotactic guided core needle
biopsy left breast calcifications.

EXAM:
DIAGNOSTIC LEFT MAMMOGRAM POST STEREOTACTIC BIOPSY

[L ML synth-2D]
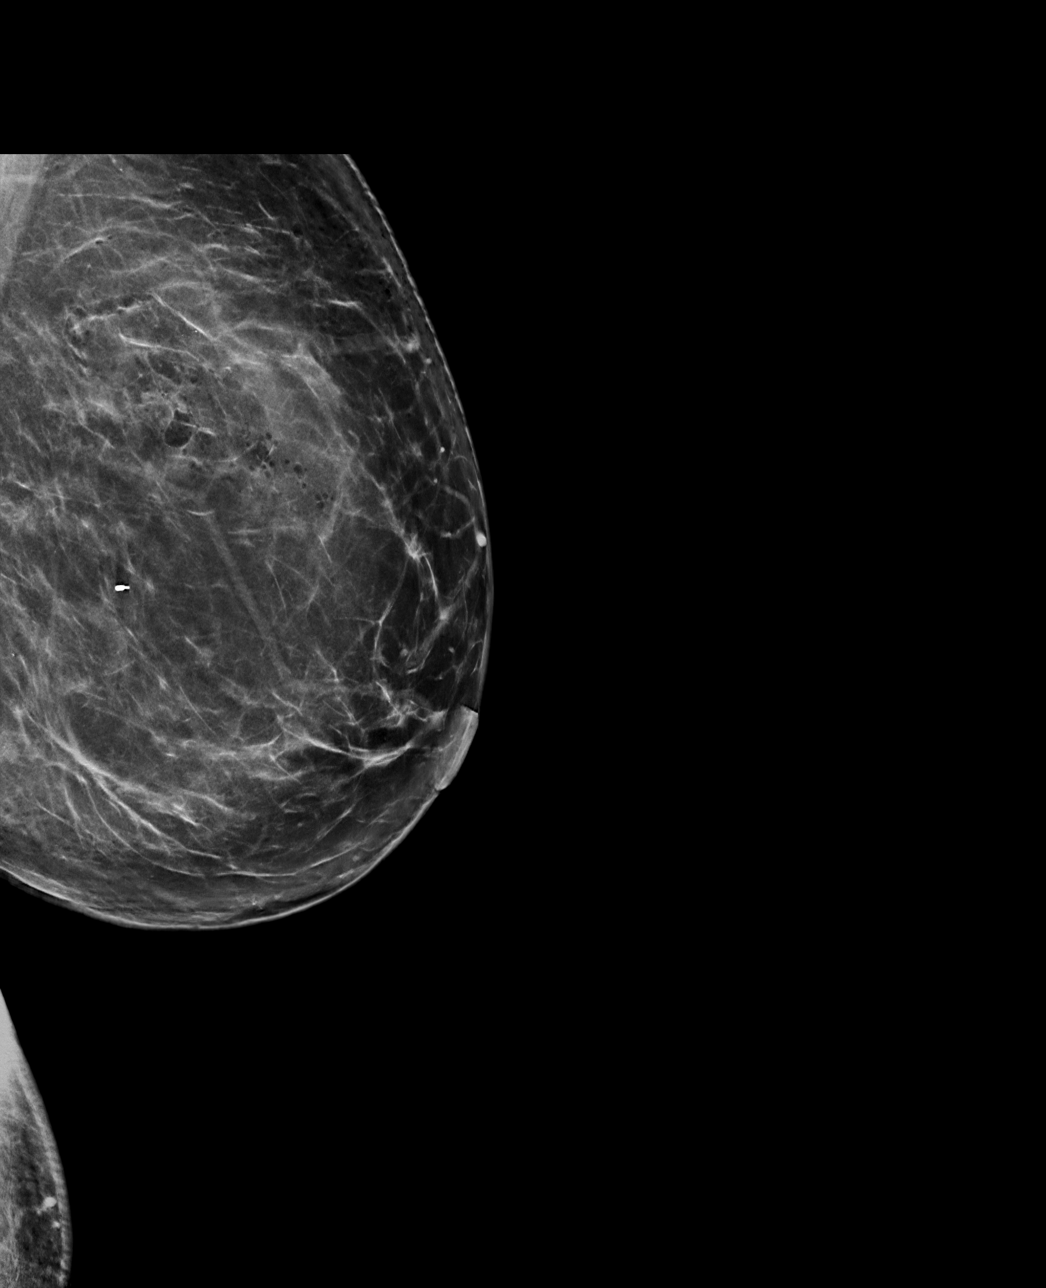

[L CC synth-2D]
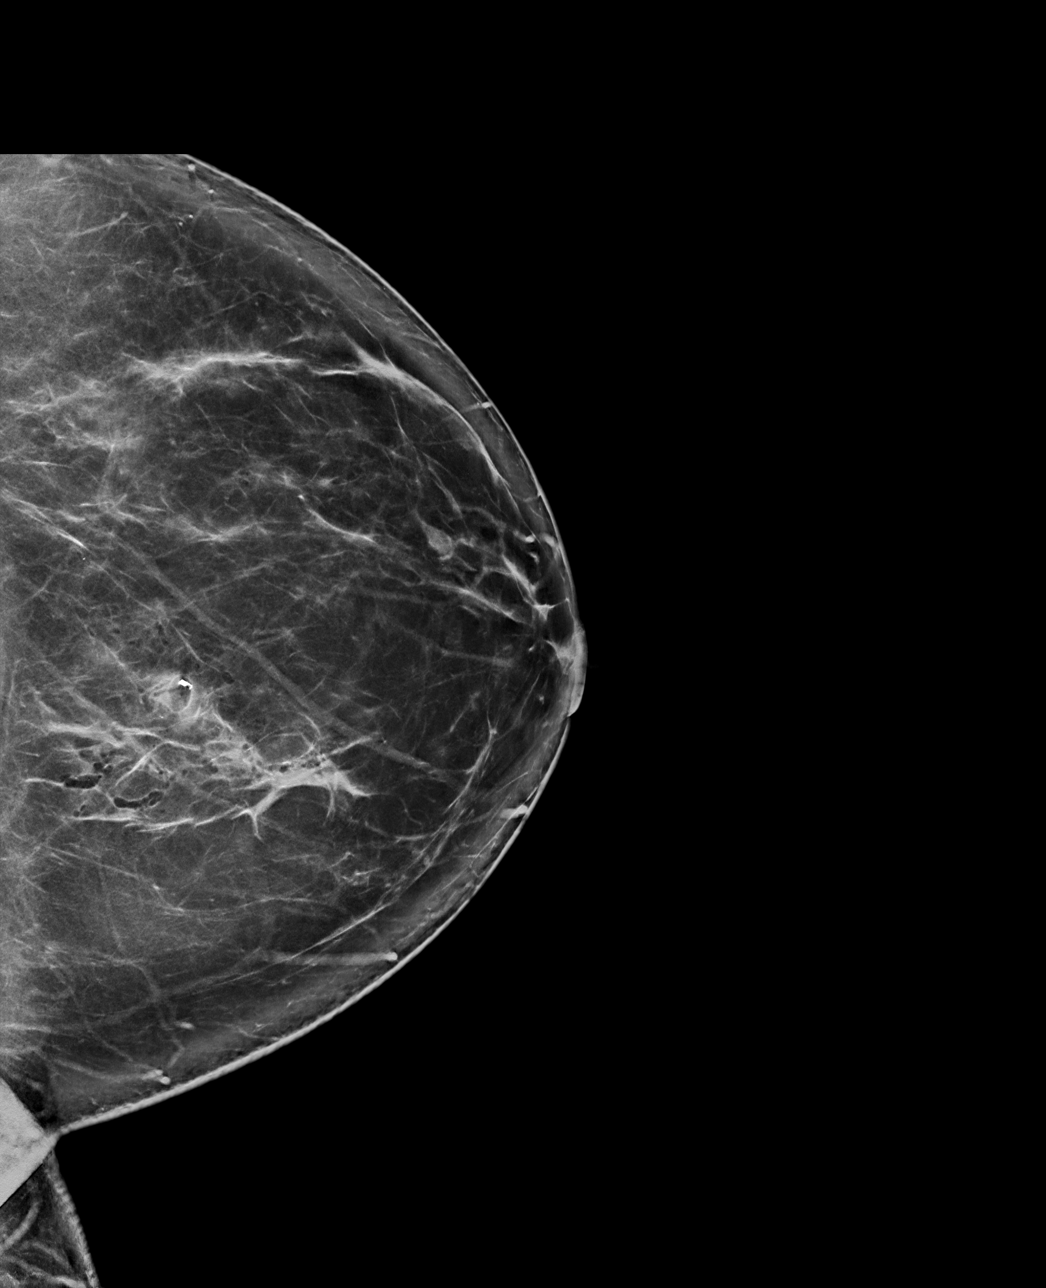

[L CC tomo · tomo slice 47/94.0]
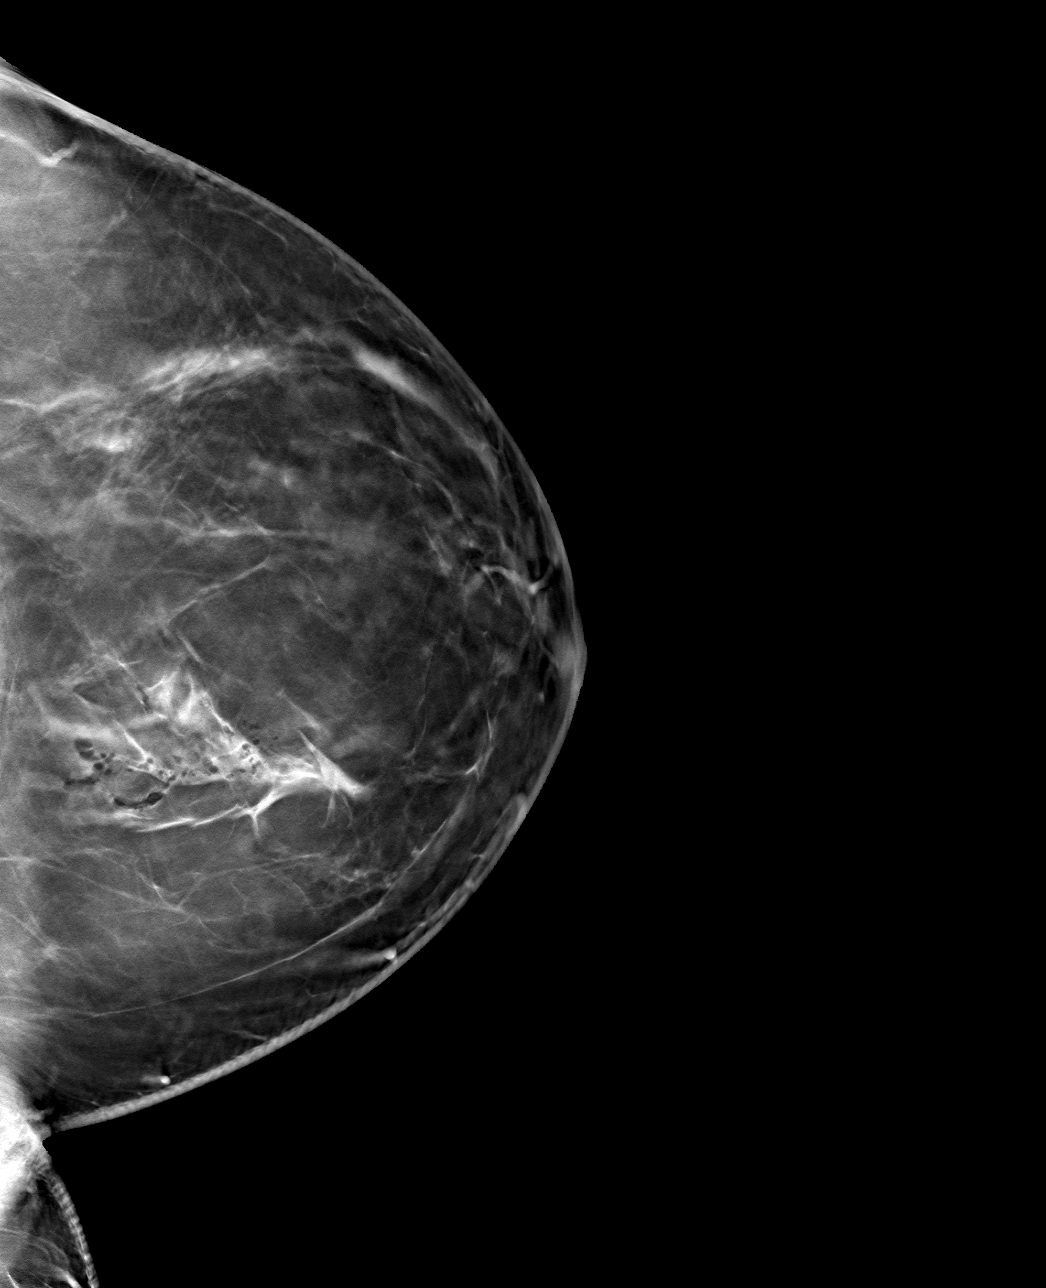

[L ML tomo · tomo slice 50/99.0]
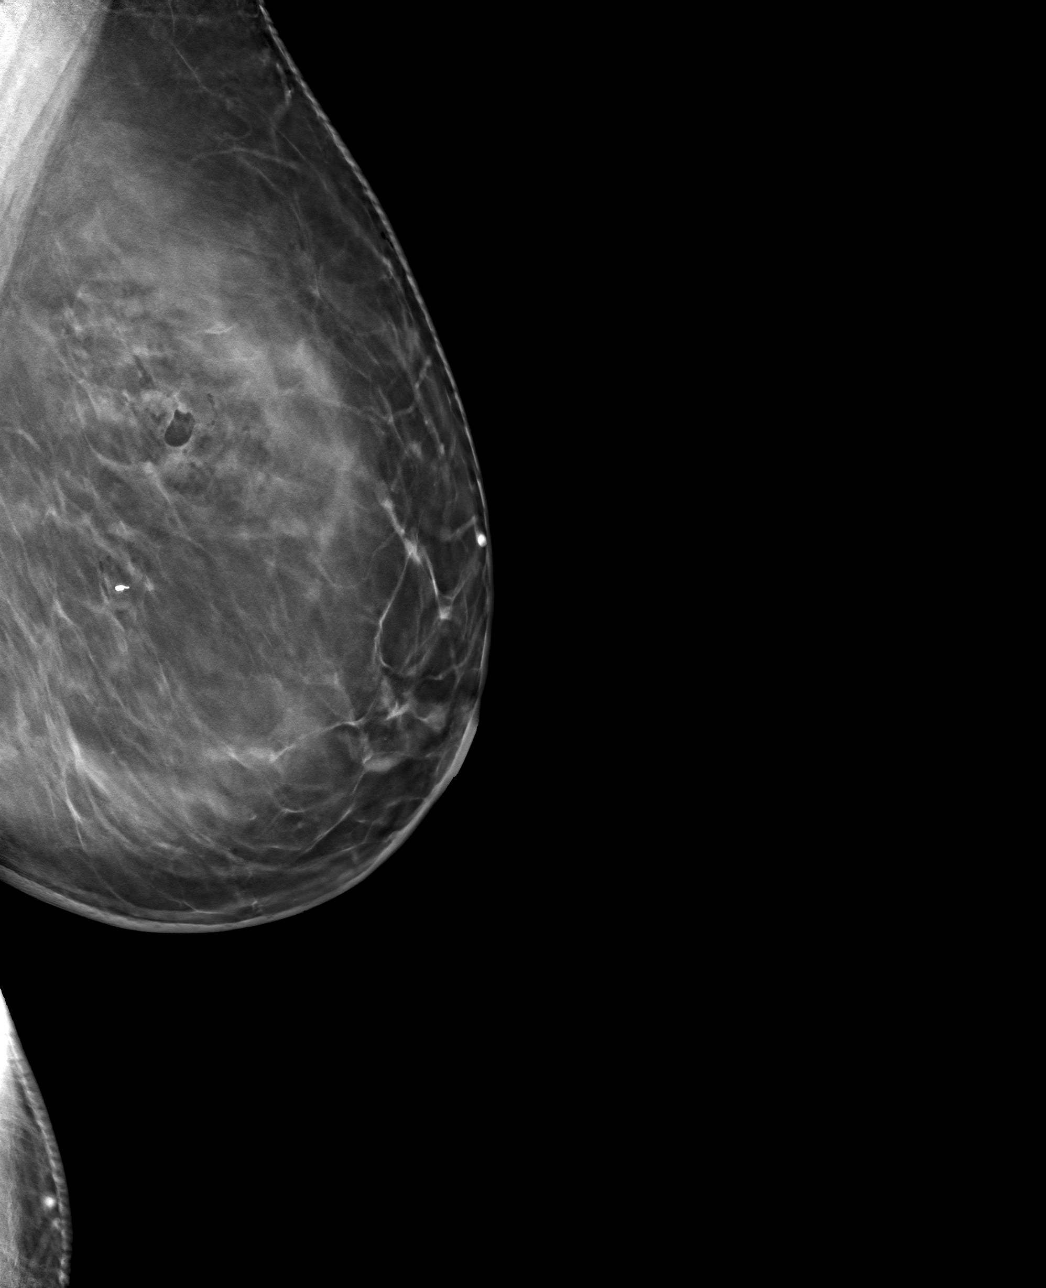

[4 of 12 positions shown; findings below may reference images not displayed]

FINDINGS: Mammographic images were obtained following stereotactic guided
biopsy of left breast calcifications. The biopsy marking clip is in
expected position at the site of biopsy.
IMPRESSION: Appropriate positioning of the coil shaped biopsy marking clip at
the site of biopsy in the central left breast.

Final Assessment: Post Procedure Mammograms for Marker Placement

## 2022-11-11 ENCOUNTER — Other Ambulatory Visit: Payer: Self-pay | Admitting: Internal Medicine

## 2022-11-11 DIAGNOSIS — R1084 Generalized abdominal pain: Secondary | ICD-10-CM

## 2022-11-11 DIAGNOSIS — I10 Essential (primary) hypertension: Secondary | ICD-10-CM | POA: Diagnosis not present

## 2022-11-11 DIAGNOSIS — R748 Abnormal levels of other serum enzymes: Secondary | ICD-10-CM | POA: Diagnosis not present

## 2022-11-11 DIAGNOSIS — M47816 Spondylosis without myelopathy or radiculopathy, lumbar region: Secondary | ICD-10-CM | POA: Diagnosis not present

## 2022-11-12 ENCOUNTER — Ambulatory Visit
Admission: RE | Admit: 2022-11-12 | Discharge: 2022-11-12 | Disposition: A | Discharge status: Home / Self Care | Payer: BC Managed Care – PPO | Source: Ambulatory Visit | Attending: Internal Medicine | Admitting: Internal Medicine

## 2022-11-12 ENCOUNTER — Encounter: Payer: BC Managed Care – PPO | Admitting: Neurology

## 2022-11-12 DIAGNOSIS — R1084 Generalized abdominal pain: Secondary | ICD-10-CM | POA: Diagnosis not present

## 2022-11-12 MED ORDER — IOPAMIDOL (ISOVUE-300) INJECTION 61%
100.0000 mL | Freq: Once | INTRAVENOUS | Status: AC | PRN
  Administered 2022-11-12: 100 mL via INTRAVENOUS

## 2022-12-07 ENCOUNTER — Other Ambulatory Visit (HOSPITAL_COMMUNITY): Payer: Self-pay | Admitting: Internal Medicine

## 2022-12-07 DIAGNOSIS — R7301 Impaired fasting glucose: Secondary | ICD-10-CM | POA: Diagnosis not present

## 2022-12-07 DIAGNOSIS — E039 Hypothyroidism, unspecified: Secondary | ICD-10-CM | POA: Diagnosis not present

## 2022-12-07 DIAGNOSIS — E88819 Insulin resistance, unspecified: Secondary | ICD-10-CM | POA: Diagnosis not present

## 2022-12-07 DIAGNOSIS — R109 Unspecified abdominal pain: Secondary | ICD-10-CM

## 2022-12-07 DIAGNOSIS — K828 Other specified diseases of gallbladder: Secondary | ICD-10-CM

## 2022-12-14 DIAGNOSIS — E559 Vitamin D deficiency, unspecified: Secondary | ICD-10-CM | POA: Diagnosis not present

## 2022-12-14 DIAGNOSIS — E039 Hypothyroidism, unspecified: Secondary | ICD-10-CM | POA: Diagnosis not present

## 2022-12-14 DIAGNOSIS — E88819 Insulin resistance, unspecified: Secondary | ICD-10-CM | POA: Diagnosis not present

## 2022-12-14 DIAGNOSIS — I10 Essential (primary) hypertension: Secondary | ICD-10-CM | POA: Diagnosis not present

## 2022-12-17 ENCOUNTER — Encounter (HOSPITAL_COMMUNITY)
Admission: RE | Admit: 2022-12-17 | Discharge: 2022-12-17 | Disposition: A | Discharge status: Home / Self Care | Payer: BC Managed Care – PPO | Source: Ambulatory Visit | Attending: Internal Medicine | Admitting: Internal Medicine

## 2022-12-17 DIAGNOSIS — K828 Other specified diseases of gallbladder: Secondary | ICD-10-CM | POA: Insufficient documentation

## 2022-12-17 DIAGNOSIS — R109 Unspecified abdominal pain: Secondary | ICD-10-CM | POA: Diagnosis not present

## 2022-12-17 DIAGNOSIS — R1011 Right upper quadrant pain: Secondary | ICD-10-CM | POA: Diagnosis not present

## 2022-12-17 MED ORDER — TECHNETIUM TC 99M MEBROFENIN IV KIT
4.9000 | PACK | Freq: Once | INTRAVENOUS | Status: AC | PRN
  Administered 2022-12-17: 4.9 via INTRAVENOUS

## 2022-12-21 DIAGNOSIS — R1011 Right upper quadrant pain: Secondary | ICD-10-CM | POA: Diagnosis not present

## 2022-12-21 DIAGNOSIS — Z800 Family history of malignant neoplasm of digestive organs: Secondary | ICD-10-CM | POA: Diagnosis not present

## 2022-12-21 DIAGNOSIS — R1013 Epigastric pain: Secondary | ICD-10-CM | POA: Diagnosis not present

## 2022-12-21 DIAGNOSIS — Z8719 Personal history of other diseases of the digestive system: Secondary | ICD-10-CM | POA: Diagnosis not present

## 2022-12-21 DIAGNOSIS — R1010 Upper abdominal pain, unspecified: Secondary | ICD-10-CM | POA: Diagnosis not present

## 2022-12-21 DIAGNOSIS — R195 Other fecal abnormalities: Secondary | ICD-10-CM | POA: Diagnosis not present

## 2022-12-21 DIAGNOSIS — G8929 Other chronic pain: Secondary | ICD-10-CM | POA: Diagnosis not present

## 2022-12-24 DIAGNOSIS — R7989 Other specified abnormal findings of blood chemistry: Secondary | ICD-10-CM | POA: Diagnosis not present

## 2022-12-24 DIAGNOSIS — I10 Essential (primary) hypertension: Secondary | ICD-10-CM | POA: Diagnosis not present

## 2022-12-24 DIAGNOSIS — R7401 Elevation of levels of liver transaminase levels: Secondary | ICD-10-CM | POA: Diagnosis not present

## 2022-12-28 ENCOUNTER — Ambulatory Visit (INDEPENDENT_AMBULATORY_CARE_PROVIDER_SITE_OTHER): Payer: BC Managed Care – PPO | Admitting: Nurse Practitioner

## 2022-12-28 ENCOUNTER — Encounter: Payer: Self-pay | Admitting: Nurse Practitioner

## 2022-12-28 VITALS — BP 140/80 | HR 81 | Ht 66.0 in | Wt 215.0 lb

## 2022-12-28 DIAGNOSIS — R109 Unspecified abdominal pain: Secondary | ICD-10-CM

## 2022-12-28 DIAGNOSIS — M7918 Myalgia, other site: Secondary | ICD-10-CM | POA: Diagnosis not present

## 2022-12-28 MED ORDER — METHOCARBAMOL 500 MG PO TABS: 500.0000 mg | ORAL_TABLET | Freq: Every day | ORAL | 0 refills | Status: AC

## 2022-12-28 NOTE — Patient Instructions (Addendum)
_______________________________________________________  If your blood pressure at your visit was 140/90 or greater, please contact your primary care physician to follow up on this.  If you are age 57 or younger, your body mass index should be between 19-25. Your Body mass index is 34.7 kg/m. If this is out of the aformentioned range listed, please consider follow up with your Primary Care Provider.  ________________________________________________________  The [facility] providers would like to encourage you to use [facility] to communicate with providers for non-urgent requests or questions.  Due to long hold times on the telephone, sending your provider a message by [facility] may be a faster and more efficient way to get a response.  Please allow 48 business hours for a response.  Please remember that this is for non-urgent requests.  _______________________________________________________  Please purchase the following medications over the counter and take as directed:  Ibuprofen 600mg  three times per day for 1 week.  Prilosec 20mg  every morning while using Ibuprofen Salon Paz patch as directed for 1 week.    We have sent the following medications to your pharmacy for you to pick up at your convenience:  START: Robaxin 500mg  at bedtime  Please call our office in 1 week to report on how you are feeling.  Thank you for entrusting me with your care and choosing [facility].  Willette Cluster, NP

## 2022-12-28 NOTE — Progress Notes (Unsigned)
Primary GI: [name], MD (2015)  ASSESSMENT & PLAN   57 y.o. yo female with a past medical history consisting of, but not necessarily limited to hepatic steatosis, hypothyroidism, B12 deficiency, diverticular disease status post remote resection  Chronic, intermittent pain of a very localized area in right flank overlying and also just beneath bottom rib. Pain seems most likely musculoskeletal in nature. Workup over the years has been unrevealing including 4 CT scan ( most recent was 12/21/22), [place] in 2015 and a recent HIDA scan.  -- Will treat with a short course of NSAIDs /muscle relaxer /lidocaine patch. She will let us know in the next few days if there has been any improvement.  -- She is very worried about biliary cancer as stools have been more pale over the last couple of months. Her imaging and labs are reassuring with normal LFTs, no masses or biliary duct dilation on imaging.   Weight loss. Weight down 30 pounds since April. However, she resumed Phenetamine in April.   Reported history of lumbar spine ( L5) disc disease  Remote bowel resection for diverticular disease.   Addendum: Realized after visit was over that patient is overdue for screening colonoscopy. Will arrange for this to be scheduled.    HPI   Brief GI History   Patient was last seen in June 2015, at that time for evaluation of right flank/RUQ pain.  Pain felt to be musculoskeletal in origin.  She has not since been seen in our office since 2015 but has since had further imaging for same abdominal pain . Workup has included:  Ultrasound March 2015 was unremarkable.   CT scan of the abdomen and pelvis with contrast 10/2013 was unrevealing  CT scan of the chest and abdomen with contrast March 2019 was unrevealing.    CT scan 11/12/2022 for evaluation of generalized abdominal pain   No findings to explain the pain.    HIDA 12/17/22 Patent cystic and common bile duct.  EF 40% ( normal)  CT AP with contrast  12/21/22 ( [facility]) No acute findings. Mild hepatic steatosis.   Chief complaint : Right flank pain   Emily Carroll did not recall that she was seen here in 2015 for RUQ/right flank pain.  In the last two months the R flank pain has been occurring everyday though it isn't constant.  The pain is dull. It varies in intensity for unclear reasons. Pain has no relationship to eating, physical activity, or position. It is not associated with any nausea   Pain is not relieved with bowel movements though she was was having recent problems with constipation but that resolved with increased water intake. Heat helps (she think) . She doesn't recall trying NSAIDs for the pain in the past.  Has never tried a muscle relaxer.   She has lost 30 pounds since April but resumed Phentermine around then.   Emily Carroll mentions that her stools have been pale for two months which is about the time the right flank pain got worse.  She is concerned about biliary cancer.   Emily Carroll went to [facility] 12/21/22 for worsening of right flank pain and also starting mid back pain. Another CT scan repeated and unrevealing. CBC ,normal . LFTs normal.  UA showed increased amount of urobilinogen which she inquires about.    NM Hepato W/EF CLINICAL DATA:  Right upper quadrant pain  EXAM: NUCLEAR MEDICINE HEPATOBILIARY IMAGING WITH GALLBLADDER EF  TECHNIQUE: Sequential images of the abdomen were obtained out to 60  minutes following intravenous administration of radiopharmaceutical. After oral ingestion of Ensure, gallbladder ejection fraction was determined. At 60 min, normal ejection fraction is greater than 33%.  RADIOPHARMACEUTICALS:  4.9 mCi Tc-37m  Choletec IV  COMPARISON:  None Available.  FINDINGS: Prompt uptake and biliary excretion of activity by the liver is seen. Gallbladder activity is visualized, consistent with patency of cystic duct. Biliary activity passes into small bowel, consistent with patent common bile  duct.  Calculated gallbladder ejection fraction is 40%. (Normal gallbladder ejection fraction with Ensure is greater than 33%.)  IMPRESSION: 1.  Cystic and common bile ducts are patent.  2.  Calculated gallbladder ejection fraction of 40%.  Electronically Signed   By: Jearld Lesch M.D.   On: 12/17/2022 15:51    Past Medical History:  Diagnosis Date   Abdominal pain, left lower quadrant    Anal polyp    Colon polyps    Diverticulitis 2008   Diverticulosis of colon (without mention of hemorrhage)    Duodenitis without mention of hemorrhage    Hyperlipidemia    Unspecified hypothyroidism    Vitamin B12 deficiency    Past Surgical History:  Procedure Laterality Date   ABDOMINAL HYSTERECTOMY  2008   COLON RESECTION  2008   COLONOSCOPY  2012   repeat 10 yrs    ESOPHAGOGASTRODUODENOSCOPY  2007   normal    Family History  Problem Relation Age of Onset   Diabetes Maternal Grandmother    Colon polyps Maternal Aunt    Social History   Tobacco Use   Smoking status: Never   Smokeless tobacco: Never  Substance Use Topics   Alcohol use: No   Drug use: No   Current Outpatient Medications  Medication Sig Dispense Refill   Alpha-Lipoic Acid (LIPOIC ACID PO) Take by mouth.     ARMOUR THYROID PO Take 30 mg by mouth. Pt takes Monday - Saturday     BIOTIN 5000 PO Take by mouth.     MAGNESIUM PO Take by mouth.     Methylcobalamin (METHYL B-12 PO) Take by mouth.     Misc Natural Products (JOINT HEALTH PO) Take by mouth.     telmisartan (MICARDIS) 40 MG tablet Take 1 tablet by mouth daily.     thyroid (ARMOUR) 240 MG tablet Take 120 mg by mouth daily. Take 1 tablet x 5 days and 1/2 tablet on the 6th day     UNABLE TO FIND 1,500 mg. Med Name: Tumeric     UNABLE TO FIND Med Name: Gluten ease     UNABLE TO FIND Med Name: leptin  detox     UNABLE TO FIND Med Name: thyroid support     No current facility-administered medications for this visit.   No Known Allergies   Review  of Systems: Positive for arthritis, back pain, fatigue, tinnitus.  All other systems reviewed and negative except where noted in HPI.   Wt Readings from Last 3 Encounters:  07/29/22 244 lb (110.7 kg)  10/11/13 210 lb (95.3 kg)  11/26/11 199 lb (90.3 kg)    Physical Exam:  BP (!) 140/80   Pulse 81   Ht 5\' 6"  (1.676 m)   Wt 215 lb (97.5 kg)   BMI 34.70 kg/m  Constitutional:  Pleasant, generally well appearing female in no acute distress. Psychiatric:  Normal mood and affect. Behavior is normal. EENT: Pupils normal.  Conjunctivae are normal. No scleral icterus. Neck supple.  Cardiovascular: Normal rate, regular rhythm.  Pulmonary/chest: Effort normal  and breath sounds normal. No wheezing, rales or rhonchi. Abdominal: Soft, nondistended, nontender. Bowel sounds active throughout. There are no masses palpable. No hepatomegaly. Musculoskeletal: localized tenderness to right flank overlying and just below bottom rib Neurological: Alert and oriented to person place and time.  Willette Cluster, NP  12/28/2022, 8:31 AM

## 2022-12-29 ENCOUNTER — Encounter: Payer: Self-pay | Admitting: Nurse Practitioner

## 2022-12-29 ENCOUNTER — Telehealth: Payer: Self-pay | Admitting: Nurse Practitioner

## 2022-12-29 NOTE — Progress Notes (Signed)
Addendum: Reviewed and agree with assessment and management plan. Agree with recommendation for colonoscopy.  Would also perform upper endoscopy given right upper quadrant pain and history of B12 deficiency.  Rule out atrophic gastritis. Elevated urobilinogen is nonspecific and her liver enzymes including bilirubin and alkaline phosphatase are normal. Her liver imaging to date has been normal as has her bile duct. Ultimately if upper endoscopy and colonoscopy are normal and pain continues could consider MRI abdomen with and without contrast plus MRCP Jermiyah Ricotta, Carie Caddy, MD

## 2022-12-29 NOTE — Telephone Encounter (Signed)
Inbound call from patient requesting know if it is okay to proceed with scheduling colonoscopy. States she forgot to ask in yesterdays 8/26 office visit. Please advise on scheduling. Thank you.

## 2022-12-31 ENCOUNTER — Encounter: Payer: Self-pay | Admitting: *Deleted

## 2022-12-31 ENCOUNTER — Encounter: Payer: BC Managed Care – PPO | Admitting: Neurology

## 2022-12-31 ENCOUNTER — Other Ambulatory Visit: Payer: Self-pay | Admitting: *Deleted

## 2022-12-31 DIAGNOSIS — R1011 Right upper quadrant pain: Secondary | ICD-10-CM

## 2022-12-31 DIAGNOSIS — R109 Unspecified abdominal pain: Secondary | ICD-10-CM

## 2022-12-31 DIAGNOSIS — D126 Benign neoplasm of colon, unspecified: Secondary | ICD-10-CM

## 2022-12-31 DIAGNOSIS — K620 Anal polyp: Secondary | ICD-10-CM

## 2022-12-31 MED ORDER — NA SULFATE-K SULFATE-MG SULF 17.5-3.13-1.6 GM/177ML PO SOLN: 1.0000 | Freq: Once | ORAL | 0 refills | Status: AC

## 2022-12-31 NOTE — Telephone Encounter (Signed)
Called patient to schedule colonoscopy appt, scheduled for 02/11/23 with Dr. Rhea Belton. Patient states she is not diabetic nor is she presently on blood thinners. Patient refused initial available appt on 02/17/23 due to birthday. Was able to find another appt scheduled for 02/11/23 at 4:00pm. Patient was informed to be at the appt at 3:00p. Patient did not have MyChart set up initially and explained to patient the reason for having the MyChart in order to receive the Instructions for the colonoscopy prep, after stating she did not have the time to come by the office to obtain the instructions in person. Patient's last OV was on 12/28/22 with Ms. Wilmon Pali, NP. Patient also states she had set up her MyChart while on the phone with the nurse, instructions will be sent to her MyChart. Informed the patient to call if she has any questions about the instructions being sent to her MyChart. Patient transferred to Scheduling to make appt for her spouse.

## 2023-01-01 ENCOUNTER — Telehealth: Payer: Self-pay | Admitting: Nurse Practitioner

## 2023-01-01 ENCOUNTER — Telehealth: Payer: Self-pay | Admitting: *Deleted

## 2023-01-01 NOTE — Telephone Encounter (Signed)
-----   Message from Willette Cluster sent at 12/30/2022  6:17 PM EDT ----- Regarding: Add EGD to colonoscopy Alona Bene, please let Salome know that I tried to call her for left the office this evening but got her voicemail and did not leave a message.  You have been in contact with her about scheduling her screening colonoscopy, I reached back and told you that would be great.  I spoke to Dr. Rhea Belton because I know she has been very worried about this ongoing right flank pain.  If she is agreeable he thinks that an upper endoscopy at the time of colonoscopy is reasonable if you can get both of those scheduled at the same time.   Also, please ask her about the B12.  Does she have a true B12 deficiency or she taking it for neuropathy?  She has been worried about cancer, especially 1 involving the liver or bile ducts.  Her urobilinogen was mildly elevated on recent urinalysis.  We do not normally interpret urinalysis studies but I did talk with Dr. Rhea Belton and this is what he had to say about the elevated urobilinogen and also her concerns about a bile duct/liver cancer.  "Elevated urobilinogen is nonspecific and her liver enzymes including bilirubin and alkaline phosphatase are normal. Her liver imaging to date has been normal as has her bile duct. Ultimately if upper endoscopy and colonoscopy are normal and pain continues could consider MRI abdomen with and without contrast plus MRCP  Thanks Alona Bene,  Pg

## 2023-01-01 NOTE — Telephone Encounter (Signed)
Called the patient to see if she had made a decision about doing both the Endo/ colon per Dr. Lauro Franklin recommendations. Patient was also sent information via MyChart in regards to the scheduled appts available for Dr. Rhea Belton to do the procedure on 12/31/22. Patient was also notified this am in relation to making the appt for the endo/colon. Spoke to patient this am and was informed that she will keep her appt for the colonoscopy on 02/11/23 and will be scheduling another appt separately for the endoscopy. Patient also states she will wants to be placed on the waiting list. Held appts for 11/6 and 11/7 have been removed.

## 2023-01-01 NOTE — Telephone Encounter (Signed)
Called patient to discuss her report of blood in the stool . Patient states she noted a ribbon of blood noted in her stool this am. States this is the first time this has happened. Patient is also concerned about enzymes and fecal tests that she feels have not been addressed, although the liver enzymes are normal. Patient is also concerned about her increased bilirubin in her urine, as well as the tan colored stools that she has been having for the last 3 months. She has ordered an at home hemoccult stool test since she noted the bleeding this am. Patient has informed the nurse that her sister has bile duct CA. Noted CT scans, [place], and HIDA scan. Patient wants there to be every precaution taken in order to make sure nothing is overlooked for a possible existing problem and is willing to take any test available. Patient also states she is drinking water as Willette Cluster, NP has suggested, as well as taking the muscle relaxant, which helps some. States the mid- back pain is at a 1-3 pain level on occasion. Instructed patient if rectal bleeding is large in amount to go to the ED.

## 2023-01-01 NOTE — Telephone Encounter (Signed)
Patient is scheduled on 02/11/23 for a colon and a endo on 02/25/23 because Dr. Rhea Belton did not have an opening for both procedures.  She is wanting to let Willette Cluster know she is seeing some blood in her stool and requesting to speak with nurse.

## 2023-01-01 NOTE — Telephone Encounter (Signed)
Also does she need to schedule a previsit appt for her endo?

## 2023-01-05 NOTE — Telephone Encounter (Signed)
Options include looking for another [facility] provider with an earlier opening for 2 procedures (endoscopy and colonoscopy); versus splitting the upper endoscopy and colonoscopy into 2 visits --starting with a colonoscopy given recent blood in stool Even if the procedures are separated and done separately they could be offered with another provider with a sooner opening if she wishes and the provider is comfortable doing so

## 2023-01-05 NOTE — Telephone Encounter (Signed)
Patient chose separate procedures and is aware of appts.

## 2023-01-06 ENCOUNTER — Encounter: Payer: Self-pay | Admitting: *Deleted

## 2023-01-29 ENCOUNTER — Encounter: Payer: Self-pay | Admitting: Internal Medicine

## 2023-02-03 ENCOUNTER — Telehealth: Payer: Self-pay | Admitting: Neurology

## 2023-02-03 DIAGNOSIS — M5417 Radiculopathy, lumbosacral region: Secondary | ICD-10-CM

## 2023-02-03 NOTE — Telephone Encounter (Signed)
Pt is asking to be scheduled for another epidural

## 2023-02-03 NOTE — Addendum Note (Signed)
Addended by: Bertram Savin on: 02/03/2023 01:37 PM   Modules accepted: Orders

## 2023-02-03 NOTE — Telephone Encounter (Signed)
Please order thank you.

## 2023-02-03 NOTE — Telephone Encounter (Signed)
I returned the patient's call. She states she actually got great relief from the [facility] which was done on 08/25/22. She states the numbness in her LLE never went away but the burning/bee sting feeling in LLE did go away along with the back pain after ESI. She states just now in the last week the pain in her back and bee sting feeling while walking is coming back.  She states the pain is in the exact same area as prior and the same type of pain.  She is supposed to go to [place] at the beginning of November and she would really like another injection to get some relief before then.  She states she called [facility] radiology first and was told she is allowed to have 3 injections in a period of 6 months.  While the patient was on the phone she rescheduled EMG/NCS to 04/08/2023 due to a colonoscopy and endoscopy planned for the same week as the 10/21 EMG.  I let her know I would get back to her.  She thanked me for the call.

## 2023-02-03 NOTE — Telephone Encounter (Signed)
ESI ordered exactly as prior. I called the patient to let her know and she was very appreciative. She will watch for a call from GI to schedule.

## 2023-02-05 ENCOUNTER — Other Ambulatory Visit: Payer: Self-pay | Admitting: Internal Medicine

## 2023-02-05 ENCOUNTER — Other Ambulatory Visit: Payer: Self-pay | Admitting: *Deleted

## 2023-02-05 ENCOUNTER — Telehealth: Payer: Self-pay | Admitting: Internal Medicine

## 2023-02-05 DIAGNOSIS — K620 Anal polyp: Secondary | ICD-10-CM

## 2023-02-05 DIAGNOSIS — K5730 Diverticulosis of large intestine without perforation or abscess without bleeding: Secondary | ICD-10-CM

## 2023-02-05 DIAGNOSIS — D126 Benign neoplasm of colon, unspecified: Secondary | ICD-10-CM

## 2023-02-05 MED ORDER — SUPREP BOWEL PREP KIT 17.5-3.13-1.6 GM/177ML PO SOLN
1.0000 | ORAL | 0 refills | Status: DC
Start: 2023-02-05 — End: 2023-02-11

## 2023-02-05 NOTE — Telephone Encounter (Signed)
Patient requesting for prep medication to be called into [facility] in [place] on [address]. Please advise, thank you.

## 2023-02-05 NOTE — Telephone Encounter (Signed)
Called patient in reference to the change in Pharmacies. Patient's Suprep was sent to previous listed pharmacy [facility]. Patient has called to inform nurse to send order of medications to [facility] [address] [place], [place]. Suprep ordered and sent to this [facility] at patient's request.

## 2023-02-11 ENCOUNTER — Ambulatory Visit: Payer: BC Managed Care – PPO | Admitting: Internal Medicine

## 2023-02-11 ENCOUNTER — Encounter: Payer: Self-pay | Admitting: Internal Medicine

## 2023-02-11 DIAGNOSIS — K620 Anal polyp: Secondary | ICD-10-CM

## 2023-02-11 DIAGNOSIS — K5730 Diverticulosis of large intestine without perforation or abscess without bleeding: Secondary | ICD-10-CM

## 2023-02-11 MED ORDER — SODIUM CHLORIDE 0.9 % IV SOLN: 500.0000 mL | Freq: Once | INTRAVENOUS | Status: DC

## 2023-02-11 NOTE — Progress Notes (Signed)
During admission interview it was noted that pt was not instructed to hold her Phentermine-Topiramate (Qsymia) diet medication. MD spoke with pt and explained the policy for holding this medication for 10 days for safety concerns. Pt verbalized understanding. Pt provided with Plenvu sample and new instructions printed and reviewed with pt. Pt has appointment for EndoColon on 02/25/23 at 10am.

## 2023-02-11 NOTE — Progress Notes (Signed)
Patient had not stopped phentermine plan presenting for colonoscopy today Procedure will need to be rescheduled

## 2023-02-12 NOTE — Discharge Instructions (Signed)

## 2023-02-15 ENCOUNTER — Ambulatory Visit
Admission: RE | Admit: 2023-02-15 | Discharge: 2023-02-15 | Disposition: A | Discharge status: Home / Self Care | Payer: BC Managed Care – PPO | Source: Ambulatory Visit | Attending: Neurology | Admitting: Neurology

## 2023-02-15 DIAGNOSIS — M5417 Radiculopathy, lumbosacral region: Secondary | ICD-10-CM

## 2023-02-15 DIAGNOSIS — M5416 Radiculopathy, lumbar region: Secondary | ICD-10-CM | POA: Diagnosis not present

## 2023-02-15 MED ORDER — IOPAMIDOL (ISOVUE-M 200) INJECTION 41%
1.0000 mL | Freq: Once | INTRAMUSCULAR | Status: AC
  Administered 2023-02-15: 1 mL via EPIDURAL

## 2023-02-15 MED ORDER — METHYLPREDNISOLONE ACETATE 40 MG/ML INJ SUSP (RADIOLOG
80.0000 mg | Freq: Once | INTRAMUSCULAR | Status: AC
  Administered 2023-02-15: 80 mg via EPIDURAL

## 2023-02-21 ENCOUNTER — Encounter: Payer: Self-pay | Admitting: Certified Registered Nurse Anesthetist

## 2023-02-22 ENCOUNTER — Encounter: Payer: BC Managed Care – PPO | Admitting: Neurology

## 2023-02-24 ENCOUNTER — Telehealth: Payer: Self-pay | Admitting: Internal Medicine

## 2023-02-24 NOTE — Telephone Encounter (Signed)
Called and spoke with patient. She stated that she can still proceed with the procedure tomorrow. Advised patient to drink plenty of fluids for the rest of the day, and after starting prep this evening. Pt verbalized understanding, and had no further concerns at the end of the call.

## 2023-02-24 NOTE — Telephone Encounter (Signed)
Inbound call from patient stating she ate a few carrots and a small amount of salad yesterday 10/23. Patient is requesting a call to make sure it is okay to proceed with tomorrows procedures. Please advise, thank you.

## 2023-02-25 ENCOUNTER — Encounter: Payer: Self-pay | Admitting: Internal Medicine

## 2023-02-25 ENCOUNTER — Encounter: Payer: BC Managed Care – PPO | Admitting: Internal Medicine

## 2023-02-25 ENCOUNTER — Ambulatory Visit: Payer: BC Managed Care – PPO | Admitting: Internal Medicine

## 2023-02-25 VITALS — BP 103/55 | HR 72 | Temp 97.7°F | Resp 16 | Ht 66.0 in | Wt 215.0 lb

## 2023-02-25 DIAGNOSIS — K2970 Gastritis, unspecified, without bleeding: Secondary | ICD-10-CM

## 2023-02-25 DIAGNOSIS — K3189 Other diseases of stomach and duodenum: Secondary | ICD-10-CM | POA: Diagnosis not present

## 2023-02-25 DIAGNOSIS — Z1211 Encounter for screening for malignant neoplasm of colon: Secondary | ICD-10-CM | POA: Diagnosis not present

## 2023-02-25 DIAGNOSIS — K5730 Diverticulosis of large intestine without perforation or abscess without bleeding: Secondary | ICD-10-CM

## 2023-02-25 DIAGNOSIS — K620 Anal polyp: Secondary | ICD-10-CM

## 2023-02-25 DIAGNOSIS — R109 Unspecified abdominal pain: Secondary | ICD-10-CM

## 2023-02-25 MED ORDER — SODIUM CHLORIDE 0.9 % IV SOLN: 500.0000 mL | INTRAVENOUS | Status: DC

## 2023-02-25 NOTE — Progress Notes (Signed)
Report given to PACU, vss 

## 2023-02-25 NOTE — Progress Notes (Signed)
0945 HR > 100 with esmolol 25 mg given IV, MD updated, vss

## 2023-02-25 NOTE — Progress Notes (Signed)
0950 HR > 100 with esmolol 25 mg given IV, MD updated, vss

## 2023-02-25 NOTE — Op Note (Signed)
[facility] Patient Name: Emily Carroll Procedure Date: 02/25/2023 9:36 AM MRN: [id number] Endoscopist: Beverley Fiedler , MD, [id number] Age: 57 Referring MD:  Date of Birth: Mar 18, 1966 Gender: Female Account #: [id number] Procedure:                Colonoscopy Indications:              Screening for colorectal malignant neoplasm Medicines:                Monitored Anesthesia Care Procedure:                Pre-Anesthesia Assessment:                           - Prior to the procedure, a History and Physical                            was performed, and patient medications and                            allergies were reviewed. The patient's tolerance of                            previous anesthesia was also reviewed. The risks                            and benefits of the procedure and the sedation                            options and risks were discussed with the patient.                            All questions were answered, and informed consent                            was obtained. Prior Anticoagulants: The patient has                            taken no anticoagulant or antiplatelet agents. ASA                            Grade Assessment: II - A patient with mild systemic                            disease. After reviewing the risks and benefits,                            the patient was deemed in satisfactory condition to                            undergo the procedure.                           After obtaining informed consent, the colonoscope  was passed under direct vision. Throughout the                            procedure, the patient's blood pressure, pulse, and                            oxygen saturations were monitored continuously. The                            Olympus Scope SN [id number] was introduced through the                            anus and advanced to the cecum, identified by                            appendiceal  orifice and ileocecal valve. The                            colonoscopy was performed without difficulty. The                            patient tolerated the procedure well. The quality                            of the bowel preparation was good. The ileocecal                            valve, appendiceal orifice, and rectum were                            photographed. Scope In: 9:50:20 AM Scope Out: 9:59:05 AM Scope Withdrawal Time: 0 hours 7 minutes 57 seconds  Total Procedure Duration: 0 hours 8 minutes 45 seconds  Findings:                 Anal skin tags were found on perianal exam.                           The terminal ileum appeared normal.                           Multiple medium-mouthed and small-mouthed                            diverticula were found in the descending colon and                            transverse colon.                           There was evidence of a prior end-to-end                            colo-colonic anastomosis in the sigmoid colon. This  was patent and was characterized by healthy                            appearing mucosa.                           Internal hemorrhoids were found during                            retroflexion. The hemorrhoids were small. Complications:            No immediate complications. Estimated Blood Loss:     Estimated blood loss: none. Impression:               - Normal examined terminal ileum.                           - Mild diverticulosis in the descending colon and                            in the transverse colon.                           - Patent end-to-end colo-colonic anastomosis,                            characterized by healthy appearing mucosa.                           - Small internal hemorrhoids with anal skin tags.                           - No specimens collected. Recommendation:           - Patient has a contact number available for                             emergencies. The signs and symptoms of potential                            delayed complications were discussed with the                            patient. Return to normal activities tomorrow.                            Written discharge instructions were provided to the                            patient.                           - Resume previous diet.                           - Continue present medications.                           -  Repeat colonoscopy in 10 years for screening                            purposes. Beverley Fiedler, MD 02/25/2023 10:06:55 AM This report has been signed electronically.

## 2023-02-25 NOTE — Progress Notes (Signed)
Called to room to assist during endoscopic procedure.  Patient ID and intended procedure confirmed with present staff. Received instructions for my participation in the procedure from the performing physician.  

## 2023-02-25 NOTE — Patient Instructions (Signed)
Continue present medications.  Awaiting pathology reports from endoscopy.  Repeat colonoscopy in 10 years for screening purposes.  Handouts on Gastritis, hemorrhoids, and diverticulosis given.   YOU HAD AN ENDOSCOPIC PROCEDURE TODAY AT THE [facility]:   Refer to the procedure report that was given to you for any specific questions about what was found during the examination.  If the procedure report does not answer your questions, please call your gastroenterologist to clarify.  If you requested that your care partner not be given the details of your procedure findings, then the procedure report has been included in a sealed envelope for you to review at your convenience later.  YOU SHOULD EXPECT: Some feelings of bloating in the abdomen. Passage of more gas than usual.  Walking can help get rid of the air that was put into your GI tract during the procedure and reduce the bloating. If you had a lower endoscopy (such as a colonoscopy or flexible sigmoidoscopy) you may notice spotting of blood in your stool or on the toilet paper. If you underwent a bowel prep for your procedure, you may not have a normal bowel movement for a few days.  Please Note:  You might notice some irritation and congestion in your nose or some drainage.  This is from the oxygen used during your procedure.  There is no need for concern and it should clear up in a day or so.  SYMPTOMS TO REPORT IMMEDIATELY:  Following lower endoscopy (colonoscopy or flexible sigmoidoscopy):  Excessive amounts of blood in the stool  Significant tenderness or worsening of abdominal pains  Swelling of the abdomen that is new, acute  Fever of 100F or higher  Following upper endoscopy (EGD)  Vomiting of blood or coffee ground material  New chest pain or pain under the shoulder blades  Painful or persistently difficult swallowing  New shortness of breath  Fever of 100F or higher  Black, tarry-looking stools  For urgent  or emergent issues, a gastroenterologist can be reached at any hour by calling [phone]. Do not use MyChart messaging for urgent concerns.    DIET:  We do recommend a small meal at first, but then you may proceed to your regular diet.  Drink plenty of fluids but you should avoid alcoholic beverages for 24 hours.  ACTIVITY:  You should plan to take it easy for the rest of today and you should NOT DRIVE or use heavy machinery until tomorrow (because of the sedation medicines used during the test).    FOLLOW UP: Our staff will call the number listed on your records the next business day following your procedure.  We will call around 7:15- 8:00 am to check on you and address any questions or concerns that you may have regarding the information given to you following your procedure. If we do not reach you, we will leave a message.     If any biopsies were taken you will be contacted by phone or by letter within the next 1-3 weeks.  Please call us at [phone] if you have not heard about the biopsies in 3 weeks.    SIGNATURES/CONFIDENTIALITY: You and/or your care partner have signed paperwork which will be entered into your electronic medical record.  These signatures attest to the fact that that the information above on your After Visit Summary has been reviewed and is understood.  Full responsibility of the confidentiality of this discharge information lies with you and/or your care-partner.

## 2023-02-25 NOTE — Op Note (Signed)
[facility] Patient Name: Emily Carroll Procedure Date: 02/25/2023 9:37 AM MRN: [id number] Endoscopist: Beverley Fiedler , MD, [id number] Age: 57 Referring MD:  Date of Birth: 1965/09/27 Gender: Female Account #: [id number] Procedure:                Upper GI endoscopy Indications:              Abdominal pain in the right upper quadrant/right                            flank pain, B12 deficiency Medicines:                Monitored Anesthesia Care Procedure:                Pre-Anesthesia Assessment:                           - Prior to the procedure, a History and Physical                            was performed, and patient medications and                            allergies were reviewed. The patient's tolerance of                            previous anesthesia was also reviewed. The risks                            and benefits of the procedure and the sedation                            options and risks were discussed with the patient.                            All questions were answered, and informed consent                            was obtained. Prior Anticoagulants: The patient has                            taken no anticoagulant or antiplatelet agents. ASA                            Grade Assessment: II - A patient with mild systemic                            disease. After reviewing the risks and benefits,                            the patient was deemed in satisfactory condition to                            undergo the procedure.  After obtaining informed consent, the endoscope was                            passed under direct vision. Throughout the                            procedure, the patient's blood pressure, pulse, and                            oxygen saturations were monitored continuously. The                            Olympus Scope [id number] was introduced through the                            mouth, and advanced to  the second part of duodenum.                            The upper GI endoscopy was accomplished without                            difficulty. The patient tolerated the procedure                            well. Scope In: Scope Out: Findings:                 The examined esophagus was normal.                           A single small submucosal papule (nodule) was found                            in the gastric fundus. Approximate size 1 cm.                           Mild inflammation characterized by erosions and                            granularity was found in the prepyloric region of                            the stomach. Biopsies were taken with a cold                            forceps for histology and Helicobacter pylori                            testing.                           The examined duodenum was normal. Complications:            No immediate complications. Estimated Blood Loss:     Estimated blood loss: none. Impression:               -  Normal esophagus.                           - A single submucosal papule (nodule) found in the                            stomach (fundus), approximately 1 cm.                           - Mild gastritis. Biopsied.                           - Normal examined duodenum. Recommendation:           - Patient has a contact number available for                            emergencies. The signs and symptoms of potential                            delayed complications were discussed with the                            patient. Return to normal activities tomorrow.                            Written discharge instructions were provided to the                            patient.                           - Resume previous diet.                           - Continue present medications.                           - Await pathology results.                           - Nonurgent EUS to be considered for                            characterization  of gastric submucosal                            lesion/nodule.                           - See the other procedure note for documentation of                            additional recommendations. Beverley Fiedler, MD 02/25/2023 10:03:40 AM This report has been signed electronically.

## 2023-02-25 NOTE — Progress Notes (Signed)
8270 Robinul 0.1 mg IV given due large amount of secretions upon assessment.  MD made aware, vss

## 2023-02-25 NOTE — Progress Notes (Signed)
GASTROENTEROLOGY PROCEDURE H&P NOTE   Primary Care Physician: Geoffry Paradise, MD    Reason for Procedure:  Right upper quadrant/flank pain, B12 deficiency and colon cancer screening  Plan:    EGD and colonoscopy  Patient is appropriate for endoscopic procedure(s) in the ambulatory ([facility]) setting.  The nature of the procedure, as well as the risks, benefits, and alternatives were carefully and thoroughly reviewed with the patient. Ample time for discussion and questions allowed. The patient understood, was satisfied, and agreed to proceed.     HPI: Emily Carroll is a 57 y.o. female who presents for EGD and colonoscopy.  Medical history as below.  Tolerated the prep.  No recent chest pain or shortness of breath.  No abdominal pain today.  Past Medical History:  Diagnosis Date   Abdominal pain, left lower quadrant    Anal polyp    Colon polyps    Diverticulitis 2008   Diverticulosis of colon (without mention of hemorrhage)    Duodenitis without mention of hemorrhage    Hyperlipidemia    Unspecified hypothyroidism    Vitamin B12 deficiency     Past Surgical History:  Procedure Laterality Date   ABDOMINAL HYSTERECTOMY  2008   COLON RESECTION  2008   COLONOSCOPY  2012   repeat 10 yrs    ESOPHAGOGASTRODUODENOSCOPY  2007   normal     Prior to Admission medications   Medication Sig Start Date End Date Taking? Authorizing Provider  Alpha-Lipoic Acid (LIPOIC ACID PO) Take by mouth.    [provider]  ARMOUR THYROID 30 MG tablet Take 30 mg by mouth 3 (three) times a week.    [provider]  BIOTIN 5000 PO Take by mouth.    [provider]  MAGNESIUM PO Take by mouth.    [provider]  Methylcobalamin (METHYL B-12 PO) Take by mouth.    [provider]  Misc Natural Products (JOINT HEALTH PO) Take by mouth.    [provider]  Phentermine-Topiramate (QSYMIA PO) Take by mouth.    [provider]  QSYMIA  7.5-46 MG CP24 Take 1 capsule by mouth daily. 10/26/22   [provider]  QSYMIA 7.5-46 MG CP24 Take 1 capsule by mouth daily. 12/03/22   [provider]  QSYMIA 7.5-46 MG CP24 Take 1 capsule by mouth daily. 01/26/23   [provider]  telmisartan (MICARDIS) 40 MG tablet Take 1 tablet by mouth daily.    [provider]  thyroid (ARMOUR) 240 MG tablet Take 120 mg by mouth daily. Pt take 30mg  3 times a week    [provider]  UNABLE TO FIND 1,500 mg. Med Name: Tumeric Patient not taking: Reported on 12/28/2022    [provider]    Current Outpatient Medications  Medication Sig Dispense Refill   Alpha-Lipoic Acid (LIPOIC ACID PO) Take by mouth.     ARMOUR THYROID 30 MG tablet Take 30 mg by mouth 3 (three) times a week.     BIOTIN 5000 PO Take by mouth.     MAGNESIUM PO Take by mouth.     Methylcobalamin (METHYL B-12 PO) Take by mouth.     Misc Natural Products (JOINT HEALTH PO) Take by mouth.     Phentermine-Topiramate (QSYMIA PO) Take by mouth.     QSYMIA 7.5-46 MG CP24 Take 1 capsule by mouth daily.     QSYMIA 7.5-46 MG CP24 Take 1 capsule by mouth daily.     QSYMIA 7.5-46  MG CP24 Take 1 capsule by mouth daily.     telmisartan (MICARDIS) 40 MG tablet Take 1 tablet by mouth daily.     thyroid (ARMOUR) 240 MG tablet Take 120 mg by mouth daily. Pt take 30mg  3 times a week     UNABLE TO FIND 1,500 mg. Med Name: Tawana Scale (Patient not taking: Reported on 12/28/2022)     Current Facility-Administered Medications  Medication Dose Route Frequency Provider Last Rate Last Admin   0.9 %  sodium chloride infusion  500 mL Intravenous Once Micco Bourbeau, Carie Caddy, MD       0.9 %  sodium chloride infusion  500 mL Intravenous Continuous Cathline Dowen, Carie Caddy, MD        Allergies as of 02/25/2023   (No Known Allergies)    Family History  Problem Relation Age of Onset   Diabetes Maternal Grandmother    Colon polyps Maternal Aunt     Social History   Socioeconomic  History   Marital status: Married    Spouse name: Not on file   Number of children: 0   Years of education: Not on file   Highest education level: Not on file  Occupational History   Occupation: Optometrist: [employer]   Occupation: HR  Tobacco Use   Smoking status: Never   Smokeless tobacco: Never  Vaping Use   Vaping status: Never Used  Substance and Sexual Activity   Alcohol use: No   Drug use: No   Sexual activity: Not on file  Other Topics Concern   Not on file  Social History Narrative   Not on file   Social Determinants of Health   Financial Resource Strain: Not on file  Food Insecurity: Not on file  Transportation Needs: Not on file  Physical Activity: Not on file  Stress: Not on file  Social Connections: Not on file  Intimate Partner Violence: Not on file    Physical Exam: Vital signs in last 24 hours: @BP  (!) 126/91   Pulse 96   Temp 97.7 F (36.5 C)   Ht 5\' 6"  (1.676 m)   Wt 215 lb (97.5 kg)   SpO2 97%   BMI 34.70 kg/m  GEN: NAD EYE: Sclerae anicteric ENT: MMM CV: Non-tachycardic Pulm: CTA b/l GI: Soft, NT/ND NEURO:  Alert & Oriented x 3   [name], MD [facility]  02/25/2023 9:25 AM'

## 2023-02-26 ENCOUNTER — Telehealth: Payer: Self-pay

## 2023-02-26 NOTE — Telephone Encounter (Signed)
  Follow up Call-     02/25/2023    9:23 AM  Call back number  Post procedure Call Back phone  # [phone]  Permission to leave phone message Yes     Patient questions:  Do you have a fever, pain , or abdominal swelling? No. Pain Score  0 *  Have you tolerated food without any problems? Yes.    Have you been able to return to your normal activities? Yes.    Do you have any questions about your discharge instructions: Diet   No. Medications  No. Follow up visit  No.  Do you have questions or concerns about your Care? No.  Actions: * If pain score is 4 or above: No action needed, pain <4.

## 2023-03-02 ENCOUNTER — Telehealth: Payer: Self-pay

## 2023-03-02 LAB — SURGICAL PATHOLOGY

## 2023-03-02 NOTE — Telephone Encounter (Signed)
-----   Message from Carie Caddy Pyrtle sent at 03/01/2023  9:50 PM EDT ----- Pt was agreeable when discussed with her after EGD Vuong Musa, can you make her aware of EUS plans with Dr. Meridee Score and this EUS is not urgent JMP ----- Message ----- From: Lemar Lofty., MD Sent: 02/25/2023  12:16 PM EDT To: Beverley Fiedler, MD  JMP, Fundus more difficult to visualize sometimes, but not always. Non-urgent EUS is reasonable. If she is in, then we can work on scheduling. GM ----- Message ----- From: Beverley Fiedler, MD Sent: 02/25/2023  12:13 PM EDT To: Lemar Lofty., MD  Could you consider EUS for this small submucosal nodule in the gastric fundus? Thanks [employer]

## 2023-03-02 NOTE — Telephone Encounter (Signed)
Mansouraty, Netty Starring., MD  Pyrtle, Carie Caddy, MD; Loretha Stapler, RN Sounds good. EUS in next 3-6 months. GM    Recall has been entered - spoke with the pt and she is confused about why she needs to have an EUS and says she does not remember any conversation regarding needing to have another procedure. She would like a call from Dr Rhea Belton to discuss.  FYI Dr Rhea Belton.

## 2023-03-04 ENCOUNTER — Encounter: Payer: Self-pay | Admitting: Internal Medicine

## 2023-03-16 DIAGNOSIS — E039 Hypothyroidism, unspecified: Secondary | ICD-10-CM | POA: Diagnosis not present

## 2023-03-16 DIAGNOSIS — E88819 Insulin resistance, unspecified: Secondary | ICD-10-CM | POA: Diagnosis not present

## 2023-03-16 DIAGNOSIS — R7301 Impaired fasting glucose: Secondary | ICD-10-CM | POA: Diagnosis not present

## 2023-03-22 DIAGNOSIS — E039 Hypothyroidism, unspecified: Secondary | ICD-10-CM | POA: Diagnosis not present

## 2023-03-22 DIAGNOSIS — E669 Obesity, unspecified: Secondary | ICD-10-CM | POA: Diagnosis not present

## 2023-03-22 DIAGNOSIS — I10 Essential (primary) hypertension: Secondary | ICD-10-CM | POA: Diagnosis not present

## 2023-03-22 DIAGNOSIS — E559 Vitamin D deficiency, unspecified: Secondary | ICD-10-CM | POA: Diagnosis not present

## 2023-03-23 DIAGNOSIS — E039 Hypothyroidism, unspecified: Secondary | ICD-10-CM | POA: Diagnosis not present

## 2023-03-23 DIAGNOSIS — E785 Hyperlipidemia, unspecified: Secondary | ICD-10-CM | POA: Diagnosis not present

## 2023-03-23 DIAGNOSIS — E538 Deficiency of other specified B group vitamins: Secondary | ICD-10-CM | POA: Diagnosis not present

## 2023-03-23 DIAGNOSIS — I10 Essential (primary) hypertension: Secondary | ICD-10-CM | POA: Diagnosis not present

## 2023-03-23 DIAGNOSIS — E559 Vitamin D deficiency, unspecified: Secondary | ICD-10-CM | POA: Diagnosis not present

## 2023-03-23 DIAGNOSIS — R739 Hyperglycemia, unspecified: Secondary | ICD-10-CM | POA: Diagnosis not present

## 2023-03-29 DIAGNOSIS — Z1331 Encounter for screening for depression: Secondary | ICD-10-CM | POA: Diagnosis not present

## 2023-03-29 DIAGNOSIS — Z0000 Encounter for general adult medical examination without abnormal findings: Secondary | ICD-10-CM | POA: Diagnosis not present

## 2023-03-29 DIAGNOSIS — Z1339 Encounter for screening examination for other mental health and behavioral disorders: Secondary | ICD-10-CM | POA: Diagnosis not present

## 2023-03-29 DIAGNOSIS — I10 Essential (primary) hypertension: Secondary | ICD-10-CM | POA: Diagnosis not present

## 2023-04-08 ENCOUNTER — Encounter: Payer: BC Managed Care – PPO | Admitting: Neurology

## 2023-06-10 ENCOUNTER — Ambulatory Visit (INDEPENDENT_AMBULATORY_CARE_PROVIDER_SITE_OTHER): Payer: Self-pay | Admitting: Neurology

## 2023-06-10 ENCOUNTER — Ambulatory Visit (INDEPENDENT_AMBULATORY_CARE_PROVIDER_SITE_OTHER): Payer: BC Managed Care – PPO | Admitting: Neurology

## 2023-06-10 DIAGNOSIS — M479 Spondylosis, unspecified: Secondary | ICD-10-CM

## 2023-06-10 DIAGNOSIS — G5712 Meralgia paresthetica, left lower limb: Secondary | ICD-10-CM | POA: Diagnosis not present

## 2023-06-10 DIAGNOSIS — Z0289 Encounter for other administrative examinations: Secondary | ICD-10-CM

## 2023-06-10 NOTE — Patient Instructions (Addendum)
   Likely Meralgia Paresthetica(helped with weight loss AND low back pain(helped with the ESI and may consider L4-L5 facet injections), 2 separate conditions both improved.

## 2023-06-10 NOTE — Progress Notes (Signed)
 Full Name: Emily Carroll Gender: Female MRN #: [id number] Date of Birth: 09-05-1965    Visit Date: 06/10/2023 09:16 Age: 58 Years Examining Physician: Dr. Onetha Epp Referring Physician: Dr. Onetha Epp Height: 5 feet 6 inch    History: Low back pain and left thigh numbness  Summary: NCS performed on the bilateral lower extremities. EMG was performed on the left lower extremity. All nerves and muscles (as indicated in the following tables) were within normal limits.    Conclusion: This is a normal study.  See attached note: Given clinical symptoms and workup to date this likely low back arthropathy with concomitant left-sided meralgia paresthetica; Low back improved with injections and left-sided thigh pain(in a distribution of the lateral femoral cutaneous nerve) has improved with a 40 pound weight loss.   ------------------------------- Onetha Epp, M.D.  [facility] [address] [place], [place] [address] Tel: [phone] Fax: [phone]  Verbal informed consent was obtained from the patient, patient was informed of potential risk of procedure, including bruising, bleeding, hematoma formation, infection, muscle weakness, muscle pain, numbness, among others.        MNC    Nerve / Sites Muscle Latency Ref. Amplitude Ref. Rel Amp Segments Distance Velocity Ref. Area    ms ms mV mV %  cm m/s m/s mVms  L Peroneal - EDB     Ankle EDB 4.2 <=6.5 5.6 >=2.0 100 Ankle - EDB 9   18.5     Fib head EDB 10.4  5.1  91.5 Fib head - Ankle 28.6 46 >=44 18.9     Pop fossa EDB 12.4  5.0  98.9 Pop fossa - Fib head 9.6 49 >=44 18.7         Pop fossa - Ankle      R Peroneal - EDB     Ankle EDB 4.7 <=6.5 4.9 >=2.0 100 Ankle - EDB 9   18.9     Fib head EDB 10.8  4.6  92.6 Fib head - Ankle 28 47 >=44 19.1     Pop fossa EDB 12.2  4.3  93.2 Pop fossa - Fib head 10 68 >=44 17.9         Pop fossa - Ankle      L Tibial - AH     Ankle AH 5.4 <=5.8 10.1  >=4.0 100 Ankle - AH 9   27.9     Pop fossa AH 11.4  9.9  97.6 Pop fossa - Ankle 34 57 >=41 30.5  R Tibial - AH     Ankle AH 5.6 <=5.8 17.3 >=4.0 100 Ankle - AH 9   41.3     Pop fossa AH 11.9  11.8  68.6 Pop fossa - Ankle 38 60 >=41 32.4             SNC    Nerve / Sites Rec. Site Peak Lat Ref.  Amp Ref. Segments Distance    ms ms V V  cm  L Sural - Ankle (Calf)     Calf Ankle 2.4 <=4.4 8 >=6 Calf - Ankle 10  R Sural - Ankle (Calf)     Calf Ankle 3.1 <=4.4 9 >=6 Calf - Ankle 11  L Superficial peroneal - Ankle     Lat leg Ankle 3.5 <=4.4 9 >=6 Lat leg - Ankle 14  R Superficial peroneal - Ankle     Lat leg Ankle 3.3 <=4.4 7 >=6 Lat leg - Ankle  51             F  Wave    Nerve F Lat Ref.   ms ms  L Tibial - AH 48.5 <=56.0  R Tibial - AH 45.6 <=56.0         H Reflex    Nerve H Lat Lat Hmax   ms ms   Left Right Ref. Left Right Ref.  Tibial - Soleus 35.7 32.9 <=35.0 23.4 32.3 <=35.0         EMG Summary Table    Spontaneous MUAP Recruitment  Muscle IA Fib PSW Fasc Other Amp Dur. Poly Pattern  L. Iliopsoas Normal None None None _______ Normal Normal Normal Normal  L. Vastus medialis Normal None None None _______ Normal Normal Normal Normal  L. Tibialis anterior Normal None None None _______ Normal Normal Normal Normal  L. Gastrocnemius (Medial head) Normal None None None _______ Normal Normal Normal Normal  L. Biceps femoris (long head) Normal None None None _______ Normal Normal Normal Normal  L. Gluteus maximus Normal None None None _______ Normal Normal Normal Normal  L. Gluteus medius Normal None None None _______ Normal Normal Normal Normal  L. Lumbar paraspinals (low) Normal None None None _______ Normal Normal Normal Normal

## 2023-06-13 DIAGNOSIS — M479 Spondylosis, unspecified: Secondary | ICD-10-CM | POA: Insufficient documentation

## 2023-06-13 DIAGNOSIS — G5712 Meralgia paresthetica, left lower limb: Secondary | ICD-10-CM | POA: Insufficient documentation

## 2023-06-13 NOTE — Progress Notes (Signed)
 Discussed in detail today with patient: emg/ncs of the left leg normal.  We could not perform lateral femoral cutaneous nerve conductions due to obesity and patient.   Left Thigh Pain: Likely Meralgia Paresthetica(improved with weight loss  2. Low back pain: concomitant low back degenerative disease/facet arthropathy(helped with the ESI and may consider L4-L5 facet injections)  Discussed that these are 2 separate conditions both improved.   -Low back pain improved with ESI x 2 which helped at [facility]. We have not seen her for a year as this emg/ncs was ordered about a year and she had to cancel twice an din the meantime she had 2 ESI and she rescheduled this with us . Since we saw her she is improved after epidural steroid injections in the low back pain, also has lost 40 pounds which is likely helping low back as well as the meralgia paresthetica.  Dr. Cristal suggested L4-5 facet joints next   - The laterofemoral thigh pain improved and unlikely related to the low back (meralgia paresthetica). Likely dealing with 2 different issues, the low back pain and concomitant meralgia paresthetica. The stinging sits in the anterolateral thigh, likely meralgia paresthetica and can consider lateral femoral cutaneous nerve injections. The ESI likely helped the back pain and the weight loss has likely helped the meralgia paresthetica and the low back pain  We reviewed dermatomes and distribution of the lateral femoral cutaneous distribution and answered all questions  Reviewed below with patient    Likely Meralgia Paresthetica(helped with weight loss AND low back pain(helped with the ESI and may consider L4-L5 facet injections), 2 separate conditions both improved.      I spent 30 minutes of face-to-face and non-face-to-face time with patient on the  1. Degenerative joint disease of low back   2. Meralgia paresthetica of left side    diagnosis.  This included previsit chart review, lab review,  study review, order entry, electronic health record documentation, patient education on the different diagnostic and therapeutic options, counseling and coordination of care, risks and benefits of management, compliance, or risk factor reduction.  This does not include time spent on EMG nerve conduction study.

## 2023-06-14 NOTE — Progress Notes (Signed)
 See procedure note.

## 2023-07-26 ENCOUNTER — Telehealth: Payer: Self-pay | Admitting: Internal Medicine

## 2023-07-26 NOTE — Telephone Encounter (Signed)
 PT is calling to schedule her EUS. Please advise of available appointments.

## 2023-07-27 NOTE — Telephone Encounter (Signed)
 Pt stated that she is requesting to be scheduled for her EUS.  Pt was notified that I would notify Dr. Meridee Score nurse and that she would contact her.  Pt was notified that Dr. Meridee Score nurse is out of the office this week but will return next week and contact pt. Pt verbalized understanding with all questions answered.

## 2023-07-28 DIAGNOSIS — Z6834 Body mass index (BMI) 34.0-34.9, adult: Secondary | ICD-10-CM | POA: Diagnosis not present

## 2023-07-28 DIAGNOSIS — Z01419 Encounter for gynecological examination (general) (routine) without abnormal findings: Secondary | ICD-10-CM | POA: Diagnosis not present

## 2023-07-28 DIAGNOSIS — Z13820 Encounter for screening for osteoporosis: Secondary | ICD-10-CM | POA: Diagnosis not present

## 2023-07-28 DIAGNOSIS — Z1231 Encounter for screening mammogram for malignant neoplasm of breast: Secondary | ICD-10-CM | POA: Diagnosis not present

## 2023-07-28 DIAGNOSIS — R5383 Other fatigue: Secondary | ICD-10-CM | POA: Diagnosis not present

## 2023-08-03 NOTE — Telephone Encounter (Signed)
 EUS scheduled, pt instructed and medications reviewed.  Patient instructions mailed to home.  Patient to call with any questions or concerns.

## 2023-08-03 NOTE — Telephone Encounter (Signed)
 EUS has been set up for 08/26/23 at 12 noon at [facility] with GM

## 2023-08-18 ENCOUNTER — Telehealth: Payer: Self-pay | Admitting: Gastroenterology

## 2023-08-18 NOTE — Telephone Encounter (Signed)
 Procedure:Upper Endoscopic Ultrasound (EUS) Procedure date: 08/26/23 Procedure location: [facility] Arrival Time: 10:30 am Spoke with the patient Y/N: Yes Any prep concerns? No  Has the patient obtained the prep from the pharmacy ? No prep needed Do you have a care partner and transportation: Yes Any additional concerns? No

## 2023-08-20 ENCOUNTER — Encounter (HOSPITAL_COMMUNITY): Payer: Self-pay | Admitting: Gastroenterology

## 2023-08-23 NOTE — Telephone Encounter (Signed)
 The pt has a sister that is in ICU at [facility] and she is not able to keep appt. She will be sent new information to My Chart and mail.  The pt has been advised of the information and verbalized understanding.

## 2023-08-23 NOTE — Telephone Encounter (Signed)
 PT has to be at [facility] this week with sister and will not be able to keep appointment. Would like to know other dates to reschedule. Please advise.

## 2023-08-25 ENCOUNTER — Other Ambulatory Visit: Payer: Self-pay

## 2023-08-25 DIAGNOSIS — K3189 Other diseases of stomach and duodenum: Secondary | ICD-10-CM

## 2023-08-25 NOTE — Telephone Encounter (Signed)
 EUS scheduled, pt instructed and medications reviewed.  Patient instructions mailed to home.  Patient to call with any questions or concerns.

## 2023-08-25 NOTE — Telephone Encounter (Signed)
 EUS has been rescheduled to 10/21/23 at [facility] with GM at 9 am

## 2023-10-13 ENCOUNTER — Telehealth: Payer: Self-pay | Admitting: Gastroenterology

## 2023-10-13 ENCOUNTER — Encounter (HOSPITAL_COMMUNITY): Payer: Self-pay | Admitting: Gastroenterology

## 2023-10-13 NOTE — Progress Notes (Signed)
 Attempted to obtain medical history for pre op call via telephone, unable to reach at this time. HIPAA compliant voicemail message left requesting return call to pre surgical testing department.

## 2023-10-13 NOTE — Telephone Encounter (Signed)
 Procedure:EUS Procedure date: 10/21/23 Procedure location: [facility] Arrival Time: 7:30 am Spoke with the patient Y/N: Yes Any prep concerns? No  Has the patient obtained the prep from the pharmacy ? No prep needed Do you have a care partner and transportation: Yes Any additional concerns? No

## 2023-10-20 ENCOUNTER — Encounter (HOSPITAL_COMMUNITY): Payer: Self-pay | Admitting: Gastroenterology

## 2023-10-20 ENCOUNTER — Other Ambulatory Visit: Payer: Self-pay

## 2023-10-20 NOTE — Progress Notes (Signed)
 PCP - Suan Elm, MD Cardiologist - no  PPM/ICD -  Device Orders -  Rep Notified -   Chest x-ray -  EKG -  Stress Test -  ECHO -  Cardiac Cath -   Sleep Study -  CPAP -   Fasting Blood Sugar - n/a Checks Blood Sugar _n/a____ times a day  Blood Thinner Instructions:no Aspirin Instructions:no  Qysmia last dose 10-11-23  Activity--Able to climb a flight of stairs with no CP or SOB  Anesthesia review: HTN  Patient denies shortness of breath, fever, cough and chest pain at PAT appointment   All instructions explained to the patient, with a verbal understanding of the material. Patient agrees to go over the instructions while at home for a better understanding. Patient also instructed to self quarantine after being tested for COVID-19. The opportunity to ask questions was provided.

## 2023-10-21 ENCOUNTER — Encounter (HOSPITAL_COMMUNITY)
Admission: RE | Disposition: A | Discharge status: Home / Self Care | Payer: Self-pay | Source: Home / Self Care | Attending: Gastroenterology

## 2023-10-21 ENCOUNTER — Ambulatory Visit (HOSPITAL_COMMUNITY)
Admission: RE | Admit: 2023-10-21 | Discharge: 2023-10-21 | Disposition: A | Discharge status: Home / Self Care | Attending: Gastroenterology | Admitting: Gastroenterology

## 2023-10-21 ENCOUNTER — Encounter (HOSPITAL_COMMUNITY): Payer: Self-pay | Admitting: Gastroenterology

## 2023-10-21 ENCOUNTER — Other Ambulatory Visit: Payer: Self-pay

## 2023-10-21 ENCOUNTER — Ambulatory Visit (HOSPITAL_COMMUNITY): Admitting: Anesthesiology

## 2023-10-21 DIAGNOSIS — E039 Hypothyroidism, unspecified: Secondary | ICD-10-CM | POA: Insufficient documentation

## 2023-10-21 DIAGNOSIS — K259 Gastric ulcer, unspecified as acute or chronic, without hemorrhage or perforation: Secondary | ICD-10-CM

## 2023-10-21 DIAGNOSIS — I10 Essential (primary) hypertension: Secondary | ICD-10-CM | POA: Insufficient documentation

## 2023-10-21 DIAGNOSIS — I899 Noninfective disorder of lymphatic vessels and lymph nodes, unspecified: Secondary | ICD-10-CM | POA: Diagnosis not present

## 2023-10-21 DIAGNOSIS — K449 Diaphragmatic hernia without obstruction or gangrene: Secondary | ICD-10-CM | POA: Diagnosis not present

## 2023-10-21 DIAGNOSIS — K2289 Other specified disease of esophagus: Secondary | ICD-10-CM | POA: Diagnosis not present

## 2023-10-21 DIAGNOSIS — Z79899 Other long term (current) drug therapy: Secondary | ICD-10-CM | POA: Diagnosis not present

## 2023-10-21 DIAGNOSIS — K3189 Other diseases of stomach and duodenum: Secondary | ICD-10-CM | POA: Diagnosis not present

## 2023-10-21 DIAGNOSIS — K219 Gastro-esophageal reflux disease without esophagitis: Secondary | ICD-10-CM | POA: Diagnosis not present

## 2023-10-21 HISTORY — DX: Unspecified osteoarthritis, unspecified site: M19.90

## 2023-10-21 HISTORY — DX: Family history of other specified conditions: Z84.89

## 2023-10-21 HISTORY — PX: ESOPHAGOGASTRODUODENOSCOPY: SHX5428

## 2023-10-21 HISTORY — PX: EUS: SHX5427

## 2023-10-21 HISTORY — DX: Essential (primary) hypertension: I10

## 2023-10-21 HISTORY — DX: Gastro-esophageal reflux disease without esophagitis: K21.9

## 2023-10-21 HISTORY — DX: Anemia, unspecified: D64.9

## 2023-10-21 HISTORY — DX: Pneumonia, unspecified organism: J18.9

## 2023-10-21 HISTORY — DX: Myoneural disorder, unspecified: G70.9

## 2023-10-21 SURGERY — ULTRASOUND, UPPER GI TRACT, ENDOSCOPIC
Anesthesia: Monitor Anesthesia Care

## 2023-10-21 MED ORDER — PROPOFOL 500 MG/50ML IV EMUL
INTRAVENOUS | Status: DC | PRN
  Administered 2023-10-21: 150 ug/kg/min via INTRAVENOUS

## 2023-10-21 MED ORDER — SODIUM CHLORIDE 0.9 % IV SOLN
INTRAVENOUS | Status: DC | PRN
Start: 2023-10-21 — End: 2023-10-21

## 2023-10-21 MED ORDER — PROPOFOL 1000 MG/100ML IV EMUL
INTRAVENOUS | Status: AC
  Filled 2023-10-21: qty 100

## 2023-10-21 MED ORDER — PROPOFOL 10 MG/ML IV BOLUS
INTRAVENOUS | Status: DC | PRN
  Administered 2023-10-21: 20 mg via INTRAVENOUS
  Administered 2023-10-21: 10 mg via INTRAVENOUS
  Administered 2023-10-21 (×3): 20 mg via INTRAVENOUS

## 2023-10-21 MED ORDER — OMEPRAZOLE 40 MG PO CPDR: 40.0000 mg | DELAYED_RELEASE_CAPSULE | Freq: Every day | ORAL | 6 refills | Status: DC

## 2023-10-21 MED ORDER — PROPOFOL 500 MG/50ML IV EMUL
INTRAVENOUS | Status: AC
  Filled 2023-10-21: qty 50

## 2023-10-21 MED ORDER — OMEPRAZOLE 40 MG PO CPDR: 40.0000 mg | DELAYED_RELEASE_CAPSULE | Freq: Every day | ORAL | 12 refills | Status: AC

## 2023-10-21 NOTE — H&P (Signed)
 GASTROENTEROLOGY PROCEDURE H&P NOTE   Primary Care Physician: Suan Elm, MD  HPI: Emily Carroll is a 58 y.o. female who presents for EGD/EUS to evaluate gastric fundal subepithelial lesion.  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   No current facility-administered medications for this encounter. No Known Allergies Family History  Problem Relation Age of Onset   Diabetes Maternal Grandmother    Colon polyps Maternal Aunt    Social History   Socioeconomic History   Marital status: Married    Spouse name: Not on file   Number of children: 0   Years of education: Not on file   Highest education level: Not on file  Occupational History   Occupation: Optometrist: [employer]   Occupation: HR  Tobacco Use   Smoking status: Never   Smokeless tobacco: Never  Vaping Use   Vaping status: Never Used  Substance and Sexual Activity   Alcohol  use: Never   Drug use: No   Sexual activity: Not Currently    Birth control/protection: None  Other Topics Concern   Not on file  Social History Narrative   Not on file   Social Drivers of Health   Financial  Resource Strain: Not on file  Food Insecurity: Not on file  Transportation Needs: Not on file  Physical Activity: Not on file  Stress: Not on file  Social Connections: Not on file  Intimate Partner Violence: Not on file    Physical Exam: Today's Vitals   10/21/23 0818  BP: (!) 144/85  Resp: 13  Temp: 97.9 F (36.6 C)  TempSrc: Temporal  SpO2: 99%  Weight: 95.3 kg  Height: 5' 6 (1.676 m)  PainSc: 0-No pain   Body mass index is 33.89 kg/m. GEN: NAD EYE: Sclerae anicteric ENT: MMM CV: Non-tachycardic GI: Soft, NT/ND NEURO:  Alert & Oriented x 3  Lab Results: No results for input(s): WBC, HGB, HCT, PLT in the last 72 hours. BMET No results for input(s): NA, K, CL, CO2, GLUCOSE, BUN, CREATININE, CALCIUM in the last 72 hours. LFT No results for input(s): PROT, ALBUMIN, AST, ALT, ALKPHOS, BILITOT, BILIDIR, IBILI in the last 72 hours. PT/INR No results for input(s): LABPROT, INR in the last 72 hours.   Impression / Plan: This is a 58 y.o.female who presents for EGD/EUS to evaluate gastric fundal subepithelial lesion.  The risks of an EUS including intestinal perforation, bleeding, infection, aspiration, and medication effects were discussed as was the possibility it may not give a  definitive diagnosis if a biopsy is performed.  When a biopsy of the pancreas is done as part of the EUS, there is an additional risk of pancreatitis at the rate of about 1-2%.  It was explained that procedure related pancreatitis is typically mild, although it can be severe and even life threatening, which is why we do not perform random pancreatic biopsies and only biopsy a lesion/area we feel is concerning enough to warrant the risk.   The risks and benefits of endoscopic evaluation/treatment were discussed with the patient and/or family; these include but are not limited to the risk of perforation, infection, bleeding, missed lesions, lack of diagnosis,  severe illness requiring hospitalization, as well as anesthesia and sedation related illnesses.  The patient's history has been reviewed, patient examined, no change in status, and deemed stable for procedure.  The patient and/or family is agreeable to proceed.    Yong Henle, MD [facility] Office # [phone]

## 2023-10-21 NOTE — Anesthesia Preprocedure Evaluation (Signed)
 Anesthesia Evaluation  Patient identified by MRN, date of birth, ID band Patient awake    Reviewed: Allergy & Precautions, NPO status , Patient's Chart, lab work & pertinent test results  Airway Mallampati: II  TM Distance: >3 FB Neck ROM: Full    Dental  (+) Teeth Intact, Dental Advisory Given   Pulmonary neg pulmonary ROS   Pulmonary exam normal breath sounds clear to auscultation       Cardiovascular hypertension, Pt. on medications Normal cardiovascular exam Rhythm:Regular Rate:Normal     Neuro/Psych  Neuromuscular disease    GI/Hepatic Neg liver ROS,GERD  ,,Gastric nodule   Endo/Other  Hypothyroidism    Renal/GU negative Renal ROS     Musculoskeletal  (+) Arthritis ,    Abdominal   Peds  Hematology negative hematology ROS (+)   Anesthesia Other Findings Day of surgery medications reviewed with the patient.  Reproductive/Obstetrics                             Anesthesia Physical Anesthesia Plan  ASA: 2  Anesthesia Plan: MAC   Post-op Pain Management:    Induction: Intravenous  PONV Risk Score and Plan: 2 and TIVA  Airway Management Planned: Natural Airway and Simple Face Mask  Additional Equipment:   Intra-op Plan:   Post-operative Plan:   Informed Consent: I have reviewed the patients History and Physical, chart, labs and discussed the procedure including the risks, benefits and alternatives for the proposed anesthesia with the patient or authorized representative who has indicated his/her understanding and acceptance.     Dental advisory given  Plan Discussed with: CRNA and Anesthesiologist  Anesthesia Plan Comments:        Anesthesia Quick Evaluation

## 2023-10-21 NOTE — Transfer of Care (Signed)
 Immediate Anesthesia Transfer of Care Note  Patient: Emily Carroll  Procedure(s) Performed: ULTRASOUND, UPPER GI TRACT, ENDOSCOPIC EGD (ESOPHAGOGASTRODUODENOSCOPY)  Patient Location: PACU  Anesthesia Type:MAC  Level of Consciousness: sedated  Airway & Oxygen Therapy: Patient Spontanous Breathing and Patient connected to face mask oxygen  Post-op Assessment: Report given to RN and Post -op Vital signs reviewed and stable  Post vital signs: Reviewed and stable  Last Vitals:  Vitals Value Taken Time  BP    Temp    Pulse    Resp 14 10/21/23 09:36  SpO2    Vitals shown include unfiled device data.  Last Pain:  Vitals:   10/21/23 0818  TempSrc: Temporal  PainSc: 0-No pain         Complications: No notable events documented.

## 2023-10-21 NOTE — Discharge Instructions (Signed)

## 2023-10-21 NOTE — Anesthesia Postprocedure Evaluation (Signed)
 Anesthesia Post Note  Patient: Emily Carroll  Procedure(s) Performed: ULTRASOUND, UPPER GI TRACT, ENDOSCOPIC EGD (ESOPHAGOGASTRODUODENOSCOPY)     Patient location during evaluation: Endoscopy Anesthesia Type: MAC Level of consciousness: oriented, awake and alert and awake Pain management: pain level controlled Vital Signs Assessment: post-procedure vital signs reviewed and stable Respiratory status: spontaneous breathing, nonlabored ventilation, respiratory function stable and patient connected to nasal cannula oxygen Cardiovascular status: blood pressure returned to baseline and stable Postop Assessment: no headache, no backache and no apparent nausea or vomiting Anesthetic complications: no   No notable events documented.  Last Vitals:  Vitals:   10/21/23 0940 10/21/23 0955  BP: 128/62 (!) 126/91  Pulse: 74 68  Resp: 15 20  Temp:    SpO2: 100% 98%    Last Pain:  Vitals:   10/21/23 0955  TempSrc:   PainSc: 0-No pain                 Erin Havers

## 2023-10-21 NOTE — Op Note (Signed)
 [facility] Patient Name: Emily Carroll Procedure Date: 10/21/2023 MRN: [id number] Attending MD: Yong Henle , MD, [id number] Date of Birth: 23-Dec-1965 CSN: [id number] Age: 58 Admit Type: Outpatient Procedure:                Upper EUS Indications:              Gastric deformity on endoscopy/Subepithelial tumor                            versus extrinsic compression, Nausea Providers:                Yong Henle, MD, Bradley Caffey, Marvelyn Slim, Technician Referring MD:              Medicines:                Monitored Anesthesia Care Complications:            No immediate complications. Estimated Blood Loss:     Estimated blood loss was minimal. Procedure:                Pre-Anesthesia Assessment:                           - Prior to the procedure, a History and Physical                            was performed, and patient medications and                            allergies were reviewed. The patient's tolerance of                            previous anesthesia was also reviewed. The risks                            and benefits of the procedure and the sedation                            options and risks were discussed with the patient.                            All questions were answered, and informed consent                            was obtained. Prior Anticoagulants: The patient has                            taken no anticoagulant or antiplatelet agents. ASA                            Grade Assessment: II - A patient with mild systemic  disease. After reviewing the risks and benefits,                            the patient was deemed in satisfactory condition to                            undergo the procedure.                           After obtaining informed consent, the endoscope was                            passed under direct vision. Throughout the                             procedure, the patient's blood pressure, pulse, and                            oxygen saturations were monitored continuously. The                            [id number] ([id number]) Olympus therapeutic endoscope                            was introduced through the mouth, and advanced to                            the second part of duodenum. The [id number]                            ([id number]) Olympus radial ultrasound scope was                            introduced through the mouth, and advanced to the                            stomach for ultrasound examination from the                            esophagus and stomach. The upper EUS was                            accomplished without difficulty. The patient                            tolerated the procedure. Scope In: Scope Out: Findings:      ENDOSCOPIC FINDING: :      No gross lesions were noted in the entire esophagus.      The Z-line was irregular and was found 36 cm from the incisors.      A 1 cm hiatal hernia was present.      A single 10 mm subepithelial papule (nodule) was found in the gastric       fundus (approximately 42 cm from incisors with straight scope).      One non-bleeding linear  gastric ulcer was found in the gastric antrum.       The lesion was 14 mm in largest dimension.      Striped mildly erythematous mucosa without bleeding was found in the       gastric antrum.      No other gross lesions were noted in the entire examined stomach.       Biopsies were taken with a cold forceps for histology and Helicobacter       pylori testing.      No gross lesions were noted in the duodenal bulb, in the first portion       of the duodenum and in the second portion of the duodenum.      ENDOSONOGRAPHIC FINDING: :      A round intramural (subepithelial) lesion was found in the fundus of the       stomach. The lesion was hypoechoic. Sonographically, the lesion appeared       to originate from the muscularis propria (Layer  4). The lesion also       measured 8.5 mm by 4.4 mm in diameter. The outer endosonographic borders       were well defined.      Endosonographic images of the rest of the visualized proximal stomach       were unremarkable. No masses or other intramural lesions were identified.      There was no sign of significant endosonographic abnormality in the       gastroesophageal junction. No masses were identified.      Endosonographic imaging in the visualized portion of the liver showed no       mass.      No malignant-appearing lymph nodes were visualized in the paracardial       region (level 16), left gastric region (level 17), gastrohepatic       ligament (level 18) and celiac region (level 20).      The celiac region was visualized. Impression:               EGD impression:                           - No gross lesions in the entire esophagus. Z-line                            irregular, 36 cm from the incisors.                           - 1 cm hiatal hernia.                           - A single subepithelial papule (nodule) found in                            the fundus of the stomach (approximately 42 cm from                            incisors with straight scope).                           - Non-bleeding gastric ulcer in the antrum.                           -  Erythematous mucosa in the antrum. No other gross                            lesions in the entire stomach. Biopsied.                           - No gross lesions in the duodenal bulb, in the                            first portion of the duodenum and in the second                            portion of the duodenum.                           EUS impression:                           - An intramural (subepithelial) lesion was found in                            the fundus of the stomach. The lesion appeared to                            originate from within the muscularis propria (Layer                            4). Tissue  has not been obtained. 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                           - Endosonographic images of the rest of the                            visualized proximal stomach were unremarkable.                           - There was no sign of significant pathology in the                            gastroesophageal junction.                           - No malignant-appearing lymph nodes were                            visualized in the paracardial region (level 16),  left gastric region (level 17), gastrohepatic                            ligament (level 18) and celiac region (level 20). Moderate Sedation:      Not Applicable - Patient had care per Anesthesia. Recommendation:           - The patient will be observed post-procedure,                            until all discharge criteria are met.                           - Discharge patient to home.                           - Patient has a contact number available for                            emergencies. The signs and symptoms of potential                            delayed complications were discussed with the                            patient. Return to normal activities tomorrow.                            Written discharge instructions were provided to the                            patient.                           - Resume previous diet.                           - Use Prilosec (omeprazole) 40 mg PO BID.                           - Await path results.                           - Repeat an EGD in 4 months to check healing of the                            gastric ulcer.                           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                           - The findings and recommendations were discussed                            with the patient.                           - The findings and recommendations were discussed                            with the patient's family. Procedure Code(s):        --- Professional ---                           [phone], Esophagogastroduodenoscopy, flexible,                            transoral; with endoscopic ultrasound examination                            limited to the esophagus, stomach or duodenum, and                            adjacent structures                           43239, Esophagogastroduodenoscopy, flexible,                            transoral; with biopsy, single or multiple Diagnosis Code(s):        --- Professional ---                           K22.89, Other specified disease of esophagus                           K44.9, Diaphragmatic hernia without obstruction or                            gangrene                           K31.89, Other diseases of stomach and duodenum                           K25.9, Gastric ulcer, unspecified as acute or                            chronic, without hemorrhage or perforation                           I89.9, Noninfective disorder of lymphatic vessels                            and lymph nodes, unspecified  R11.0, Nausea CPT copyright 2022 [employer]. All rights reserved. The codes documented in this report are preliminary and upon coder review may  be revised to meet current compliance requirements. Yong Henle, MD 10/21/2023 9:42:19 AM Number of Addenda: 0

## 2023-10-22 LAB — SURGICAL PATHOLOGY

## 2023-10-23 ENCOUNTER — Ambulatory Visit: Payer: Self-pay | Admitting: Gastroenterology

## 2023-10-23 ENCOUNTER — Encounter (HOSPITAL_COMMUNITY): Payer: Self-pay | Admitting: Gastroenterology

## 2023-11-24 DIAGNOSIS — R0981 Nasal congestion: Secondary | ICD-10-CM | POA: Diagnosis not present

## 2024-03-06 DIAGNOSIS — E669 Obesity, unspecified: Secondary | ICD-10-CM | POA: Diagnosis not present

## 2024-03-06 DIAGNOSIS — I10 Essential (primary) hypertension: Secondary | ICD-10-CM | POA: Diagnosis not present

## 2024-03-06 DIAGNOSIS — E559 Vitamin D deficiency, unspecified: Secondary | ICD-10-CM | POA: Diagnosis not present

## 2024-03-06 DIAGNOSIS — E039 Hypothyroidism, unspecified: Secondary | ICD-10-CM | POA: Diagnosis not present
